# Patient Record
Sex: Female | Born: 1960 | Race: White | Hispanic: No | Marital: Married | Smoking: Never smoker
Health system: Southern US, Community
[De-identification: ages and names within clinical notes are randomized; demographics above are authoritative.]

## PROBLEM LIST (undated history)

## (undated) DIAGNOSIS — K8020 Calculus of gallbladder without cholecystitis without obstruction: Secondary | ICD-10-CM

## (undated) DIAGNOSIS — N200 Calculus of kidney: Secondary | ICD-10-CM

## (undated) DIAGNOSIS — K3532 Acute appendicitis with perforation and localized peritonitis, without abscess: Secondary | ICD-10-CM

## (undated) DIAGNOSIS — E785 Hyperlipidemia, unspecified: Secondary | ICD-10-CM

## (undated) DIAGNOSIS — E042 Nontoxic multinodular goiter: Secondary | ICD-10-CM

## (undated) DIAGNOSIS — I10 Essential (primary) hypertension: Secondary | ICD-10-CM

## (undated) DIAGNOSIS — G4733 Obstructive sleep apnea (adult) (pediatric): Secondary | ICD-10-CM

## (undated) DIAGNOSIS — G5601 Carpal tunnel syndrome, right upper limb: Secondary | ICD-10-CM

## (undated) DIAGNOSIS — Z974 Presence of external hearing-aid: Secondary | ICD-10-CM

## (undated) DIAGNOSIS — N840 Polyp of corpus uteri: Secondary | ICD-10-CM

## (undated) DIAGNOSIS — N950 Postmenopausal bleeding: Secondary | ICD-10-CM

## (undated) DIAGNOSIS — C801 Malignant (primary) neoplasm, unspecified: Secondary | ICD-10-CM

## (undated) DIAGNOSIS — M069 Rheumatoid arthritis, unspecified: Secondary | ICD-10-CM

## (undated) DIAGNOSIS — I499 Cardiac arrhythmia, unspecified: Secondary | ICD-10-CM

## (undated) DIAGNOSIS — Z862 Personal history of diseases of the blood and blood-forming organs and certain disorders involving the immune mechanism: Secondary | ICD-10-CM

## (undated) DIAGNOSIS — E669 Obesity, unspecified: Secondary | ICD-10-CM

## (undated) DIAGNOSIS — M1990 Unspecified osteoarthritis, unspecified site: Secondary | ICD-10-CM

## (undated) DIAGNOSIS — K219 Gastro-esophageal reflux disease without esophagitis: Secondary | ICD-10-CM

## (undated) DIAGNOSIS — H9191 Unspecified hearing loss, right ear: Secondary | ICD-10-CM

## (undated) DIAGNOSIS — Z973 Presence of spectacles and contact lenses: Secondary | ICD-10-CM

## (undated) DIAGNOSIS — C642 Malignant neoplasm of left kidney, except renal pelvis: Secondary | ICD-10-CM

## (undated) DIAGNOSIS — Z87442 Personal history of urinary calculi: Secondary | ICD-10-CM

## (undated) DIAGNOSIS — N2889 Other specified disorders of kidney and ureter: Secondary | ICD-10-CM

## (undated) DIAGNOSIS — J302 Other seasonal allergic rhinitis: Secondary | ICD-10-CM

## (undated) HISTORY — PX: TUBAL LIGATION: SHX77

## (undated) HISTORY — PX: DILATION AND CURETTAGE OF UTERUS: SHX78

---
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
## 2015-09-07 DIAGNOSIS — G4733 Obstructive sleep apnea (adult) (pediatric): Secondary | ICD-10-CM | POA: Insufficient documentation

## 2016-09-06 HISTORY — PX: KIDNEY SURGERY: SHX687

## 2017-05-13 ENCOUNTER — Emergency Department
Admission: EM | Admit: 2017-05-13 | Discharge: 2017-05-13 | Disposition: A | Discharge status: Home / Self Care | Payer: Managed Care, Other (non HMO) | Source: Home / Self Care | Attending: Family Medicine | Admitting: Family Medicine

## 2017-05-13 ENCOUNTER — Emergency Department (HOSPITAL_COMMUNITY): Payer: Managed Care, Other (non HMO) | Admitting: Anesthesiology

## 2017-05-13 ENCOUNTER — Emergency Department (INDEPENDENT_AMBULATORY_CARE_PROVIDER_SITE_OTHER): Payer: Managed Care, Other (non HMO)

## 2017-05-13 ENCOUNTER — Encounter (HOSPITAL_COMMUNITY): Payer: Self-pay

## 2017-05-13 ENCOUNTER — Inpatient Hospital Stay (HOSPITAL_COMMUNITY)
Admission: EM | Admit: 2017-05-13 | Discharge: 2017-05-18 | DRG: 340 | Disposition: A | Discharge status: Home / Self Care | Payer: Managed Care, Other (non HMO) | Attending: General Surgery | Admitting: General Surgery

## 2017-05-13 ENCOUNTER — Encounter: Payer: Self-pay | Admitting: *Deleted

## 2017-05-13 ENCOUNTER — Encounter (HOSPITAL_COMMUNITY): Admission: EM | Disposition: A | Discharge status: Home / Self Care | Payer: Self-pay | Source: Home / Self Care

## 2017-05-13 DIAGNOSIS — Z7952 Long term (current) use of systemic steroids: Secondary | ICD-10-CM | POA: Diagnosis not present

## 2017-05-13 DIAGNOSIS — K5641 Fecal impaction: Secondary | ICD-10-CM | POA: Diagnosis present

## 2017-05-13 DIAGNOSIS — K8080 Other cholelithiasis without obstruction: Secondary | ICD-10-CM | POA: Diagnosis not present

## 2017-05-13 DIAGNOSIS — N2889 Other specified disorders of kidney and ureter: Secondary | ICD-10-CM | POA: Diagnosis not present

## 2017-05-13 DIAGNOSIS — K3580 Unspecified acute appendicitis: Secondary | ICD-10-CM

## 2017-05-13 DIAGNOSIS — K3532 Acute appendicitis with perforation and localized peritonitis, without abscess: Secondary | ICD-10-CM

## 2017-05-13 DIAGNOSIS — K8020 Calculus of gallbladder without cholecystitis without obstruction: Secondary | ICD-10-CM

## 2017-05-13 DIAGNOSIS — M069 Rheumatoid arthritis, unspecified: Secondary | ICD-10-CM | POA: Diagnosis present

## 2017-05-13 DIAGNOSIS — Z79899 Other long term (current) drug therapy: Secondary | ICD-10-CM | POA: Diagnosis not present

## 2017-05-13 DIAGNOSIS — R1084 Generalized abdominal pain: Secondary | ICD-10-CM

## 2017-05-13 DIAGNOSIS — K352 Acute appendicitis with generalized peritonitis: Principal | ICD-10-CM | POA: Diagnosis present

## 2017-05-13 HISTORY — DX: Calculus of kidney: N20.0

## 2017-05-13 HISTORY — DX: Calculus of gallbladder without cholecystitis without obstruction: K80.20

## 2017-05-13 HISTORY — DX: Other seasonal allergic rhinitis: J30.2

## 2017-05-13 HISTORY — PX: LAPAROSCOPIC APPENDECTOMY: SHX408

## 2017-05-13 HISTORY — DX: Unspecified osteoarthritis, unspecified site: M19.90

## 2017-05-13 LAB — TYPE AND SCREEN
ABO/RH(D): A POS
Antibody Screen: NEGATIVE

## 2017-05-13 LAB — CBC WITH DIFFERENTIAL/PLATELET
Basophils Absolute: 0 10*3/uL (ref 0.0–0.1)
Basophils Relative: 0 %
Eosinophils Absolute: 0 10*3/uL (ref 0.0–0.7)
Eosinophils Relative: 0 %
HCT: 41.1 % (ref 36.0–46.0)
Hemoglobin: 14.2 g/dL (ref 12.0–15.0)
Lymphocytes Relative: 8 %
Lymphs Abs: 1 10*3/uL (ref 0.7–4.0)
MCH: 30.1 pg (ref 26.0–34.0)
MCHC: 34.5 g/dL (ref 30.0–36.0)
MCV: 87.1 fL (ref 78.0–100.0)
Monocytes Absolute: 1 10*3/uL (ref 0.1–1.0)
Monocytes Relative: 8 %
Neutro Abs: 10.8 10*3/uL — ABNORMAL HIGH (ref 1.7–7.7)
Neutrophils Relative %: 84 %
Platelets: 116 10*3/uL — ABNORMAL LOW (ref 150–400)
RBC: 4.72 MIL/uL (ref 3.87–5.11)
RDW: 13.3 % (ref 11.5–15.5)
WBC: 12.9 10*3/uL — ABNORMAL HIGH (ref 4.0–10.5)

## 2017-05-13 LAB — POCT CBC W AUTO DIFF (K'VILLE URGENT CARE)

## 2017-05-13 LAB — POCT URINALYSIS DIP (MANUAL ENTRY)
Blood, UA: NEGATIVE
Glucose, UA: NEGATIVE mg/dL
Leukocytes, UA: NEGATIVE
Nitrite, UA: POSITIVE — AB
Protein Ur, POC: 100 mg/dL — AB
Spec Grav, UA: >=1.03 — AB (ref 1.010–1.025)
Urobilinogen, UA: 1 E.U./dL
pH, UA: 6 (ref 5.0–8.0)

## 2017-05-13 LAB — BASIC METABOLIC PANEL
Anion gap: 11 (ref 5–15)
BUN: 8 mg/dL (ref 6–20)
CO2: 24 mmol/L (ref 22–32)
Calcium: 9.2 mg/dL (ref 8.9–10.3)
Chloride: 103 mmol/L (ref 101–111)
Creatinine, Ser: 0.71 mg/dL (ref 0.44–1.00)
GFR calc Af Amer: >60 mL/min (ref >60)
GFR calc non Af Amer: >60 mL/min (ref >60)
Glucose, Bld: 99 mg/dL (ref 65–99)
Potassium: 3.4 mmol/L — ABNORMAL LOW (ref 3.5–5.1)
Sodium: 138 mmol/L (ref 135–145)

## 2017-05-13 LAB — LIPASE, BLOOD: Lipase: 26 U/L (ref 11–51)

## 2017-05-13 LAB — PROTIME-INR
INR: 1.05
Prothrombin Time: 13.6 seconds (ref 11.4–15.2)

## 2017-05-13 LAB — ABO/RH: ABO/RH(D): A POS

## 2017-05-13 SURGERY — APPENDECTOMY, LAPAROSCOPIC
Anesthesia: General | Site: Abdomen

## 2017-05-13 MED ORDER — HYDROMORPHONE HCL 1 MG/ML IJ SOLN
0.2500 mg | INTRAMUSCULAR | Status: DC | PRN
  Administered 2017-05-13: 0.5 mg via INTRAVENOUS

## 2017-05-13 MED ORDER — ONDANSETRON HCL 4 MG/2ML IJ SOLN: 4.0000 mg | Freq: Four times a day (QID) | INTRAMUSCULAR | Status: DC | PRN

## 2017-05-13 MED ORDER — FENTANYL CITRATE (PF) 250 MCG/5ML IJ SOLN
INTRAMUSCULAR | Status: AC
  Filled 2017-05-13: qty 5

## 2017-05-13 MED ORDER — LACTATED RINGERS IV SOLN
INTRAVENOUS | Status: DC | PRN
  Administered 2017-05-13 (×2): via INTRAVENOUS

## 2017-05-13 MED ORDER — HYDRALAZINE HCL 20 MG/ML IJ SOLN: 10.0000 mg | INTRAMUSCULAR | Status: DC | PRN

## 2017-05-13 MED ORDER — ACETAMINOPHEN 500 MG PO TABS
1000.0000 mg | ORAL_TABLET | Freq: Four times a day (QID) | ORAL | Status: DC
  Administered 2017-05-14 – 2017-05-18 (×12): 1000 mg via ORAL
  Filled 2017-05-13 (×15): qty 2

## 2017-05-13 MED ORDER — MORPHINE SULFATE (PF) 4 MG/ML IV SOLN
4.0000 mg | Freq: Once | INTRAVENOUS | Status: AC
  Administered 2017-05-13: 4 mg via INTRAVENOUS
  Filled 2017-05-13: qty 1

## 2017-05-13 MED ORDER — SUCCINYLCHOLINE CHLORIDE 20 MG/ML IJ SOLN
INTRAMUSCULAR | Status: DC | PRN
  Administered 2017-05-13: 100 mg via INTRAVENOUS

## 2017-05-13 MED ORDER — PIPERACILLIN-TAZOBACTAM 3.375 G IVPB
3.3750 g | Freq: Three times a day (TID) | INTRAVENOUS | Status: DC
  Administered 2017-05-14 – 2017-05-17 (×12): 3.375 g via INTRAVENOUS
  Filled 2017-05-13 (×15): qty 50

## 2017-05-13 MED ORDER — SERTRALINE HCL 25 MG PO TABS
12.5000 mg | ORAL_TABLET | Freq: Every day | ORAL | Status: DC
  Administered 2017-05-14 – 2017-05-17 (×4): 12.5 mg via ORAL
  Filled 2017-05-13 (×6): qty 0.5

## 2017-05-13 MED ORDER — ONDANSETRON HCL 4 MG/2ML IJ SOLN
4.0000 mg | Freq: Once | INTRAMUSCULAR | Status: AC
  Administered 2017-05-13: 4 mg via INTRAVENOUS
  Filled 2017-05-13: qty 2

## 2017-05-13 MED ORDER — FENTANYL CITRATE (PF) 100 MCG/2ML IJ SOLN
INTRAMUSCULAR | Status: DC | PRN
  Administered 2017-05-13 (×5): 50 ug via INTRAVENOUS

## 2017-05-13 MED ORDER — BUPIVACAINE-EPINEPHRINE 0.25% -1:200000 IJ SOLN
INTRAMUSCULAR | Status: DC | PRN
  Administered 2017-05-13: 10 mL

## 2017-05-13 MED ORDER — PIPERACILLIN-TAZOBACTAM 3.375 G IVPB 30 MIN
3.3750 g | Freq: Once | INTRAVENOUS | Status: AC
  Administered 2017-05-13: 3.375 g via INTRAVENOUS
  Filled 2017-05-13: qty 50

## 2017-05-13 MED ORDER — MIDAZOLAM HCL 2 MG/2ML IJ SOLN
INTRAMUSCULAR | Status: AC
  Filled 2017-05-13: qty 2

## 2017-05-13 MED ORDER — ROCURONIUM BROMIDE 100 MG/10ML IV SOLN
INTRAVENOUS | Status: DC | PRN
  Administered 2017-05-13: 50 mg via INTRAVENOUS

## 2017-05-13 MED ORDER — MORPHINE SULFATE (PF) 4 MG/ML IV SOLN
2.0000 mg | INTRAVENOUS | Status: DC | PRN
  Administered 2017-05-14 (×3): 2 mg via INTRAVENOUS
  Filled 2017-05-13 (×4): qty 1

## 2017-05-13 MED ORDER — 0.9 % SODIUM CHLORIDE (POUR BTL) OPTIME
TOPICAL | Status: DC | PRN
  Administered 2017-05-13: 1000 mL

## 2017-05-13 MED ORDER — OXYCODONE HCL 5 MG PO TABS
5.0000 mg | ORAL_TABLET | ORAL | Status: DC | PRN
  Administered 2017-05-15 – 2017-05-17 (×3): 10 mg via ORAL
  Filled 2017-05-13 (×3): qty 2

## 2017-05-13 MED ORDER — HYDROMORPHONE HCL 1 MG/ML IJ SOLN
INTRAMUSCULAR | Status: AC
  Filled 2017-05-13: qty 1

## 2017-05-13 MED ORDER — PROPOFOL 10 MG/ML IV BOLUS
INTRAVENOUS | Status: AC
  Filled 2017-05-13: qty 20

## 2017-05-13 MED ORDER — ONDANSETRON 4 MG PO TBDP
4.0000 mg | ORAL_TABLET | Freq: Once | ORAL | Status: AC
Start: 2017-05-13 — End: 2017-05-13
  Administered 2017-05-13: 4 mg via ORAL

## 2017-05-13 MED ORDER — LIDOCAINE HCL (CARDIAC) 20 MG/ML IV SOLN
INTRAVENOUS | Status: DC | PRN
  Administered 2017-05-13: 60 mg via INTRAVENOUS

## 2017-05-13 MED ORDER — MONTELUKAST SODIUM 10 MG PO TABS
10.0000 mg | ORAL_TABLET | Freq: Every day | ORAL | Status: DC
  Administered 2017-05-14 – 2017-05-17 (×5): 10 mg via ORAL
  Filled 2017-05-13 (×5): qty 1

## 2017-05-13 MED ORDER — SUGAMMADEX SODIUM 200 MG/2ML IV SOLN
INTRAVENOUS | Status: DC | PRN
  Administered 2017-05-13: 200 mg via INTRAVENOUS

## 2017-05-13 MED ORDER — PREDNISOLONE 5 MG PO TABS
5.0000 mg | ORAL_TABLET | Freq: Every day | ORAL | Status: DC
  Administered 2017-05-14 – 2017-05-17 (×4): 5 mg via ORAL
  Filled 2017-05-13 (×5): qty 1

## 2017-05-13 MED ORDER — BUPIVACAINE-EPINEPHRINE (PF) 0.25% -1:200000 IJ SOLN
INTRAMUSCULAR | Status: AC
  Filled 2017-05-13: qty 30

## 2017-05-13 MED ORDER — SODIUM CHLORIDE 0.9 % IR SOLN
Status: DC | PRN
  Administered 2017-05-13: 1000 mL

## 2017-05-13 MED ORDER — PROPOFOL 10 MG/ML IV BOLUS
INTRAVENOUS | Status: DC | PRN
  Administered 2017-05-13: 130 mg via INTRAVENOUS

## 2017-05-13 MED ORDER — IOPAMIDOL (ISOVUE-300) INJECTION 61%
100.0000 mL | Freq: Once | INTRAVENOUS | Status: AC | PRN
  Administered 2017-05-13: 100 mL via INTRAVENOUS

## 2017-05-13 MED ORDER — SIMETHICONE 80 MG PO CHEW
40.0000 mg | CHEWABLE_TABLET | Freq: Four times a day (QID) | ORAL | Status: DC | PRN
  Administered 2017-05-15: 40 mg via ORAL
  Filled 2017-05-13: qty 1

## 2017-05-13 MED ORDER — HYDROCORTISONE NA SUCCINATE PF 100 MG IJ SOLR
INTRAMUSCULAR | Status: DC | PRN
  Administered 2017-05-13: 125 mg via INTRAVENOUS

## 2017-05-13 MED ORDER — ENOXAPARIN SODIUM 40 MG/0.4ML ~~LOC~~ SOLN
40.0000 mg | SUBCUTANEOUS | Status: DC
  Administered 2017-05-14: 40 mg via SUBCUTANEOUS
  Filled 2017-05-13 (×2): qty 0.4

## 2017-05-13 MED ORDER — ONDANSETRON 4 MG PO TBDP: 4.0000 mg | ORAL_TABLET | Freq: Four times a day (QID) | ORAL | Status: DC | PRN

## 2017-05-13 MED ORDER — SODIUM CHLORIDE 0.9 % IV SOLN
INTRAVENOUS | Status: DC
  Administered 2017-05-13: 23:00:00 via INTRAVENOUS

## 2017-05-13 MED ORDER — ONDANSETRON HCL 4 MG/2ML IJ SOLN
INTRAMUSCULAR | Status: DC | PRN
  Administered 2017-05-13: 4 mg via INTRAVENOUS

## 2017-05-13 MED ORDER — SODIUM CHLORIDE 0.9 % IV BOLUS (SEPSIS)
500.0000 mL | Freq: Once | INTRAVENOUS | Status: AC
  Administered 2017-05-13: 500 mL via INTRAVENOUS

## 2017-05-13 SURGICAL SUPPLY — 44 items
APPLIER CLIP ROT 10 11.4 M/L (STAPLE)
CANISTER SUCT 3000ML PPV (MISCELLANEOUS) ×2 IMPLANT
CHLORAPREP W/TINT 26ML (MISCELLANEOUS) ×2 IMPLANT
CLIP APPLIE ROT 10 11.4 M/L (STAPLE) IMPLANT
COVER SURGICAL LIGHT HANDLE (MISCELLANEOUS) ×2 IMPLANT
CUTTER FLEX LINEAR 45M (STAPLE) ×4 IMPLANT
DERMABOND ADHESIVE PROPEN (GAUZE/BANDAGES/DRESSINGS) ×1
DERMABOND ADVANCED (GAUZE/BANDAGES/DRESSINGS) ×1
DERMABOND ADVANCED .7 DNX12 (GAUZE/BANDAGES/DRESSINGS) ×1 IMPLANT
DERMABOND ADVANCED .7 DNX6 (GAUZE/BANDAGES/DRESSINGS) ×1 IMPLANT
DEVICE TROCAR PUNCTURE CLOSURE (ENDOMECHANICALS) ×4 IMPLANT
DRAIN CHANNEL 15F RND FF W/TCR (WOUND CARE) ×2 IMPLANT
DRAIN CHANNEL 19F RND (DRAIN) ×2 IMPLANT
ELECT REM PT RETURN 9FT ADLT (ELECTROSURGICAL) ×2
ELECTRODE REM PT RTRN 9FT ADLT (ELECTROSURGICAL) ×1 IMPLANT
EVACUATOR SILICONE 100CC (DRAIN) ×2 IMPLANT
GLOVE BIO SURGEON STRL SZ7 (GLOVE) ×2 IMPLANT
GLOVE BIOGEL PI IND STRL 7.5 (GLOVE) ×1 IMPLANT
GLOVE BIOGEL PI INDICATOR 7.5 (GLOVE) ×1
GOWN STRL REUS W/ TWL LRG LVL3 (GOWN DISPOSABLE) ×3 IMPLANT
GOWN STRL REUS W/TWL LRG LVL3 (GOWN DISPOSABLE) ×3
KIT BASIN OR (CUSTOM PROCEDURE TRAY) ×2 IMPLANT
KIT ROOM TURNOVER OR (KITS) ×2 IMPLANT
NS IRRIG 1000ML POUR BTL (IV SOLUTION) ×2 IMPLANT
PAD ARMBOARD 7.5X6 YLW CONV (MISCELLANEOUS) ×4 IMPLANT
POUCH RETRIEVAL ECOSAC 10 (ENDOMECHANICALS) ×1 IMPLANT
POUCH RETRIEVAL ECOSAC 10MM (ENDOMECHANICALS) ×1
RELOAD 45 VASCULAR/THIN (ENDOMECHANICALS) ×2 IMPLANT
RELOAD STAPLE TA45 3.5 REG BLU (ENDOMECHANICALS) ×2 IMPLANT
SCISSORS LAP 5X35 DISP (ENDOMECHANICALS) IMPLANT
SET IRRIG TUBING LAPAROSCOPIC (IRRIGATION / IRRIGATOR) ×2 IMPLANT
SHEARS HARMONIC ACE PLUS 36CM (ENDOMECHANICALS) ×2 IMPLANT
SLEEVE ENDOPATH XCEL 5M (ENDOMECHANICALS) ×2 IMPLANT
SPECIMEN JAR SMALL (MISCELLANEOUS) ×2 IMPLANT
STRIP CLOSURE SKIN 1/2X4 (GAUZE/BANDAGES/DRESSINGS) ×2 IMPLANT
SUT MNCRL AB 4-0 PS2 18 (SUTURE) ×4 IMPLANT
SUT VICRYL 0 UR6 27IN ABS (SUTURE) ×8 IMPLANT
TOWEL OR 17X24 6PK STRL BLUE (TOWEL DISPOSABLE) ×2 IMPLANT
TOWEL OR 17X26 10 PK STRL BLUE (TOWEL DISPOSABLE) ×2 IMPLANT
TRAY FOLEY CATH SILVER 16FR (SET/KITS/TRAYS/PACK) ×2 IMPLANT
TRAY LAPAROSCOPIC MC (CUSTOM PROCEDURE TRAY) ×2 IMPLANT
TROCAR XCEL BLUNT TIP 100MML (ENDOMECHANICALS) ×2 IMPLANT
TROCAR XCEL NON-BLD 5MMX100MML (ENDOMECHANICALS) ×2 IMPLANT
TUBING INSUFFLATION (TUBING) ×2 IMPLANT

## 2017-05-13 NOTE — H&P (Signed)
Amanda Brewer is an 56 y.o. female.   Chief Complaint: abd pain HPI: 33 yof with 3 days of generalized abdominal pain that is in rlq now. Nothing is helping this pain, she has n/v.  Has anorexia.  Not eating or drinking.  Had a bm last night.  Low grade fever.  She was seen at urgent care and underwent evaluation with ct that showed appendicitis. She was then transferred here.  She was diagnosed with rheumatoid arthritis and has been on prednisone for 3 months.  She is now on maintenance dose.  She has also started humira last dose 2 weeks ago.     Past Medical History:  Diagnosis Date  . Arthritis   . Gallstones   . Kidney stone   . Seasonal allergies     Past Surgical History:  Procedure Laterality Date  . DILATION AND CURETTAGE OF UTERUS    . TUBAL LIGATION      Family History  Problem Relation Age of Onset  . Hypertension Mother   . Osteoarthritis Mother   . Heart attack Father   . Stroke Father   . Alzheimer's disease Father   . Hypertension Father   . Heart attack Brother    Social History:  reports that she has never smoked. She has never used smokeless tobacco. She reports that she does not drink alcohol or use drugs.  Allergies: No Known Allergies  meds reviewed include humira, plaquenil and prednisone  Results for orders placed or performed during the hospital encounter of 05/13/17 (from the past 48 hour(s))  Basic metabolic panel     Status: Abnormal   Collection Time: 05/13/17  4:20 PM  Result Value Ref Range   Sodium 138 135 - 145 mmol/L   Potassium 3.4 (L) 3.5 - 5.1 mmol/L   Chloride 103 101 - 111 mmol/L   CO2 24 22 - 32 mmol/L   Glucose, Bld 99 65 - 99 mg/dL   BUN 8 6 - 20 mg/dL   Creatinine, Ser 0.71 0.44 - 1.00 mg/dL   Calcium 9.2 8.9 - 10.3 mg/dL   GFR calc non Af Amer >60 >60 mL/min   GFR calc Af Amer >60 >60 mL/min    Comment: (NOTE) The eGFR has been calculated using the CKD EPI equation. This calculation has not been validated in all clinical  situations. eGFR's persistently <60 mL/min signify possible Chronic Kidney Disease.    Anion gap 11 5 - 15  Lipase, blood     Status: None   Collection Time: 05/13/17  4:20 PM  Result Value Ref Range   Lipase 26 11 - 51 U/L  CBC with Differential     Status: Abnormal   Collection Time: 05/13/17  5:50 PM  Result Value Ref Range   WBC 12.9 (H) 4.0 - 10.5 K/uL   RBC 4.72 3.87 - 5.11 MIL/uL   Hemoglobin 14.2 12.0 - 15.0 g/dL   HCT 41.1 36.0 - 46.0 %   MCV 87.1 78.0 - 100.0 fL   MCH 30.1 26.0 - 34.0 pg   MCHC 34.5 30.0 - 36.0 g/dL   RDW 13.3 11.5 - 15.5 %   Platelets 116 (L) 150 - 400 K/uL    Comment: REPEATED TO VERIFY SPECIMEN CHECKED FOR CLOTS PLATELET COUNT CONFIRMED BY SMEAR    Neutrophils Relative % 84 %   Neutro Abs 10.8 (H) 1.7 - 7.7 K/uL   Lymphocytes Relative 8 %   Lymphs Abs 1.0 0.7 - 4.0 K/uL   Monocytes  Relative 8 %   Monocytes Absolute 1.0 0.1 - 1.0 K/uL   Eosinophils Relative 0 %   Eosinophils Absolute 0.0 0.0 - 0.7 K/uL   Basophils Relative 0 %   Basophils Absolute 0.0 0.0 - 0.1 K/uL  Type and screen [place]     Status: None   Collection Time: 05/13/17  5:50 PM  Result Value Ref Range   ABO/RH(D) A POS    Antibody Screen NEG    Sample Expiration 05/16/2017   ABO/Rh     Status: None   Collection Time: 05/13/17  5:50 PM  Result Value Ref Range   ABO/RH(D) A POS    Ct Abdomen Pelvis W Contrast  Result Date: 05/13/2017 CLINICAL DATA:  Abdominal pain, nausea for 3 days EXAM: CT ABDOMEN AND PELVIS WITH CONTRAST TECHNIQUE: Multidetector CT imaging of the abdomen and pelvis was performed using the standard protocol following bolus administration of intravenous contrast. CONTRAST:  13m ISOVUE-300 IOPAMIDOL (ISOVUE-300) INJECTION 61% COMPARISON:  None. FINDINGS: Lower chest: Lung bases demonstrate no acute consolidation or effusion. Normal heart size. Hepatobiliary: Large calcified gallstones. No focal hepatic abnormality. No biliary dilatation  Pancreas: Unremarkable. No pancreatic ductal dilatation or surrounding inflammatory changes. Spleen: Normal in size without focal abnormality. Adrenals/Urinary Tract: Adrenal glands are within normal limits. Kidneys show no hydronephrosis. There is a large 6.1 cm cyst in the lower pole left kidney. Immediately adjacent to this is a 4 cm solid-appearing renal mass. The bladder is unremarkable Stomach/Bowel: Stomach is nonenlarged. No dilated small bowel. Enlarged appendix measuring up to 1 cm with 6 mm appendicolith. Moderate surrounding inflammation compatible with acute appendicitis. There is free fluid in the pelvis and significant inflammatory change in the periappendiceal mesentery. There is wall thickening and inflammatory change of the adjacent distal ileal bowel loops. Vascular/Lymphatic: Aortic atherosclerosis. No enlarged abdominal or pelvic lymph nodes. Reproductive: Uterus and bilateral adnexa are unremarkable. Bilateral tubal ligation clips Other: Negative for free air.  Small fat in the umbilicus. Musculoskeletal: Degenerative changes. No acute or suspicious bone lesion IMPRESSION: 1. Findings are consistent with an acute appendicitis. Free-fluid and moderate inflammatory changes surrounding the appendix raise possibility of perforation, however no extraluminal gas collection or free air is seen. Thickening of adjacent distal small bowel loops is likely reactive. Negative for a bowel obstruction. 2. 4 cm solid-appearing mass in the lower pole of the left kidney. Further evaluation with nonemergent renal CT or MRI recommended. 3. Large gallstones Critical Value/emergent results were called by telephone at the time of interpretation on 05/13/2017 at 3:10 pm to Dr. ELeeroy Cha, who verbally acknowledged these results. Electronically Signed   By: KDonavan FoilM.D.   On: 05/13/2017 15:10    Review of Systems  Constitutional: Positive for fever.  Gastrointestinal: Positive for abdominal pain, nausea and  vomiting.  All other systems reviewed and are negative.   Blood pressure (!) 144/76, pulse 98, temperature 100.2 F (37.9 C), temperature source Oral, resp. rate 18, height '5\' 2"'$  (1.575 m), weight 71.2 kg (157 lb), SpO2 98 %. Physical Exam  Vitals reviewed. Constitutional: She is oriented to person, place, and time. She appears well-developed and well-nourished.  HENT:  Head: Normocephalic and atraumatic.  Mouth/Throat: Oropharynx is clear and moist.  Eyes: Pupils are equal, round, and reactive to light. No scleral icterus.  Neck: Neck supple.  Cardiovascular: Normal rate, regular rhythm and normal heart sounds.   Respiratory: Effort normal and breath sounds normal. She has no wheezes. She has  no rales.  GI: Soft. She exhibits no distension. Bowel sounds are decreased. There is tenderness in the right lower quadrant and suprapubic area. No hernia.  Lymphadenopathy:    She has no cervical adenopathy.  Neurological: She is alert and oriented to person, place, and time.  Skin: Skin is warm. She is diaphoretic.  Psychiatric: She has a normal mood and affect.     Assessment/Plan Appendicitis  I think she needs appendectomy.  Steroids and humira are concerning and she looks ill right now.  Will give abx and proceed with lap appy tonight.  I discussed risks including abscess, leak at stump, bleeding, infection.  Her risks are certainly increased with immunosuppressants.   Rolm Bookbinder, MD 05/13/2017, 7:03 PM

## 2017-05-13 NOTE — Op Note (Signed)
Preoperative diagnosis: acute appendicitis Postoperative diagnosis: perforated appendicitis Procedure: laparoscopic appendectomy Surgeon: Dr Serita Grammes Anesthesia: general EBL: minimal Drains none Specimen: appendix to pathology Complications: none Sponge count correct at completion Disposition to recovery stable  Indications: This is a 2 yof on immunosuppression for RA who presents with likely ruptured appendicitis. Her wbc is elevated and she appears ill  We discussed laparoscopic appendectomy.   Procedure: After informed consent was obtained the patient was taken to the operating room She was given antibiotics. Sequential compression devices were on herlegs. Shewas placed under general anesthesia without complication. Her abdomen was prepped and draped in the standard sterile surgical fashion. A surgical timeout was then performed.  I infiltrated marcaine belowthe umbilicus. I made a vertical incision and grasped the fascia. This was entered sharply. I then placed a 0 vicryl pursestring suture and inserted a Hasson trocarI then placed 2 other 5 mm trocars in llq and suprapubic region. I identified the appendix and grasped the base.  The base was clean. the terminal ileum was adherent to the appendix. I very carefully dissected the appendix out of the pelvis where it was stuck in an abscess cavity.  It had perforated and I found two fecaliths that I removed.  The abscess was drained.   I then divided the appendix at the cecum. I was able to then dissect the remaining appendix into the pocket where it was perforated and remove this.  This was placed in a bag and removed. I viewed the staple line on the cecum and this appears to be intact and viable. There was no injury to cecum or small bowel noted. I then obtained hemostasis and irrigated.I removed the hasson  trocar. I tied my pursestring down and added several 2-0vicryl stitchat the umbilical incision with the endoclose  device. I then inserted a 15 Fr Blake drain via the suprapubic port and secured this with 2-0 nylon.  I then desufflated the abdomen and removed all my remaining trocars. I then closed these with 4-0 Monocryl and Dermabond. She tolerated this well was extubated and transferred to the recovery room in stable condition

## 2017-05-13 NOTE — ED Triage Notes (Signed)
Pt sent from [facility] in [place] for possible ruptured appendix. Pt already had CT scan and blood work. VSS. Oral temp 100.3.

## 2017-05-13 NOTE — ED Notes (Signed)
Bed: GYK5 Expected date:  Expected time:  Means of arrival:  Comments: Amanda Brewer

## 2017-05-13 NOTE — ED Triage Notes (Signed)
Pt c/o generalized abd pain x 3 day. Reports diarrhea, nausea and dry heaves this morning.

## 2017-05-13 NOTE — Anesthesia Procedure Notes (Signed)
Procedure Name: Intubation Date/Time: 05/13/2017 8:50 PM Performed by: Manuela Schwartz B Pre-anesthesia Checklist: Patient identified, Emergency Drugs available, Suction available and Patient being monitored Patient Re-evaluated:Patient Re-evaluated prior to induction Oxygen Delivery Method: Circle System Utilized Preoxygenation: Pre-oxygenation with 100% oxygen Induction Type: IV induction, Rapid sequence and Cricoid Pressure applied Laryngoscope Size: Mac and 3 Grade View: Grade I Tube type: Oral Tube size: 7.0 mm Number of attempts: 1 Airway Equipment and Method: Stylet and Oral airway Placement Confirmation: ETT inserted through vocal cords under direct vision,  positive ETCO2 and breath sounds checked- equal and bilateral Secured at: 21 cm Tube secured with: Tape Dental Injury: Teeth and Oropharynx as per pre-operative assessment

## 2017-05-13 NOTE — Anesthesia Postprocedure Evaluation (Signed)
Anesthesia Post Note  Patient: Amanda Brewer  Procedure(s) Performed: Procedure(s) (LRB): APPENDECTOMY LAPAROSCOPIC (N/A)     Patient location during evaluation: PACU Anesthesia Type: General Level of consciousness: awake and alert Pain management: pain level controlled Vital Signs Assessment: post-procedure vital signs reviewed and stable Respiratory status: spontaneous breathing, nonlabored ventilation, respiratory function stable and patient connected to nasal cannula oxygen Cardiovascular status: blood pressure returned to baseline and stable Postop Assessment: no signs of nausea or vomiting Anesthetic complications: no    Last Vitals:  Vitals:   05/13/17 2300 05/13/17 2315  BP: 134/69 (!) 142/72  Pulse: 70 82  Resp: (!) 7 11  Temp:  36.8 C  SpO2: 99% 99%    Last Pain:  Vitals:   05/13/17 2300  TempSrc:   PainSc: Asleep                 Drago Hammonds,W. EDMOND

## 2017-05-13 NOTE — ED Provider Notes (Signed)
Emergency Department Provider Note   I have reviewed the triage vital signs and the nursing notes.   HISTORY  Chief Complaint Abdominal Pain and possible ruptured appendix   HPI Amanda Brewer is a 56 y.o. female presents to the emergency department for evaluation of worsening abdominal pain and new onset fever. Patient was seen at urgent care prior to ED presentation where a CT scan was obtained which showed acute appendicitis with possible rupture. Patient states she's had generalized abdominal discomfort for the last 3 days which seems to be hurting worse on the right side today. Fever began approximate 4 hours ago. Denies any chest pain or difficulty breathing.   Past Medical History:  Diagnosis Date  . Arthritis   . Gallstones   . Kidney stone   . Seasonal allergies     Patient Active Problem List   Diagnosis Date Noted  . Ruptured appendicitis 05/13/2017    Past Surgical History:  Procedure Laterality Date  . DILATION AND CURETTAGE OF UTERUS    . TUBAL LIGATION        Allergies Patient has no known allergies.  Family History  Problem Relation Age of Onset  . Hypertension Mother   . Osteoarthritis Mother   . Heart attack Father   . Stroke Father   . Alzheimer's disease Father   . Hypertension Father   . Heart attack Brother     Social History Social History  Substance Use Topics  . Smoking status: Never Smoker  . Smokeless tobacco: Never Used  . Alcohol use No    Review of Systems  Constitutional: Positive fever/chills Eyes: No visual changes. ENT: No sore throat. Cardiovascular: Denies chest pain. Respiratory: Denies shortness of breath. Gastrointestinal: Positive abdominal pain. Positive nausea, no vomiting.  No diarrhea.  No constipation. Genitourinary: Negative for dysuria. Musculoskeletal: Negative for back pain. Skin: Negative for rash. Neurological: Negative for headaches, focal weakness or numbness.  10-point ROS otherwise  negative.  ____________________________________________   PHYSICAL EXAM:  VITAL SIGNS: ED Triage Vitals  Enc Vitals Group     BP 05/13/17 1612 129/66     Pulse Rate 05/13/17 1612 88     Resp 05/13/17 1612 16     Temp 05/13/17 1612 100.2 F (37.9 C)     Temp Source 05/13/17 1612 Oral     SpO2 05/13/17 1612 100 %     Weight 05/13/17 1612 157 lb (71.2 kg)     Height 05/13/17 1612 5\' 2"  (1.575 m)     Pain Score 05/13/17 1611 6   Constitutional: Alert and oriented. Well appearing and in no acute distress. Eyes: Conjunctivae are normal. Head: Atraumatic. Nose: No congestion/rhinnorhea. Mouth/Throat: Mucous membranes are moist.  Neck: No stridor.   Cardiovascular: Normal rate, regular rhythm. Good peripheral circulation. Grossly normal heart sounds.   Respiratory: Normal respiratory effort.  No retractions. Lungs CTAB. Gastrointestinal: Soft with diffuse abdominal tenderness worse in the RLQ. No rebound or guarding. No distention.  Musculoskeletal: No lower extremity tenderness nor edema. No gross deformities of extremities. Neurologic:  Normal speech and language. No gross focal neurologic deficits are appreciated.  Skin:  Skin is warm, dry and intact. No rash noted.  ____________________________________________   LABS (all labs ordered are listed, but only abnormal results are displayed)  Labs Reviewed  BASIC METABOLIC PANEL - Abnormal; Notable for the following:       Result Value   Potassium 3.4 (*)    All other components within normal limits  CBC  WITH DIFFERENTIAL/PLATELET - Abnormal; Notable for the following:    WBC 12.9 (*)    Platelets 116 (*)    Neutro Abs 10.8 (*)    All other components within normal limits  LIPASE, BLOOD  PROTIME-INR  BASIC METABOLIC PANEL  CBC  TYPE AND SCREEN  ABO/RH  SURGICAL PATHOLOGY   ____________________________________________  RADIOLOGY  Ct Abdomen Pelvis W Contrast  Result Date: 05/13/2017 CLINICAL DATA:  Abdominal pain,  nausea for 3 days EXAM: CT ABDOMEN AND PELVIS WITH CONTRAST TECHNIQUE: Multidetector CT imaging of the abdomen and pelvis was performed using the standard protocol following bolus administration of intravenous contrast. CONTRAST:  144mL ISOVUE-300 IOPAMIDOL (ISOVUE-300) INJECTION 61% COMPARISON:  None. FINDINGS: Lower chest: Lung bases demonstrate no acute consolidation or effusion. Normal heart size. Hepatobiliary: Large calcified gallstones. No focal hepatic abnormality. No biliary dilatation Pancreas: Unremarkable. No pancreatic ductal dilatation or surrounding inflammatory changes. Spleen: Normal in size without focal abnormality. Adrenals/Urinary Tract: Adrenal glands are within normal limits. Kidneys show no hydronephrosis. There is a large 6.1 cm cyst in the lower pole left kidney. Immediately adjacent to this is a 4 cm solid-appearing renal mass. The bladder is unremarkable Stomach/Bowel: Stomach is nonenlarged. No dilated small bowel. Enlarged appendix measuring up to 1 cm with 6 mm appendicolith. Moderate surrounding inflammation compatible with acute appendicitis. There is free fluid in the pelvis and significant inflammatory change in the periappendiceal mesentery. There is wall thickening and inflammatory change of the adjacent distal ileal bowel loops. Vascular/Lymphatic: Aortic atherosclerosis. No enlarged abdominal or pelvic lymph nodes. Reproductive: Uterus and bilateral adnexa are unremarkable. Bilateral tubal ligation clips Other: Negative for free air.  Small fat in the umbilicus. Musculoskeletal: Degenerative changes. No acute or suspicious bone lesion IMPRESSION: 1. Findings are consistent with an acute appendicitis. Free-fluid and moderate inflammatory changes surrounding the appendix raise possibility of perforation, however no extraluminal gas collection or free air is seen. Thickening of adjacent distal small bowel loops is likely reactive. Negative for a bowel obstruction. 2. 4 cm  solid-appearing mass in the lower pole of the left kidney. Further evaluation with nonemergent renal CT or MRI recommended. 3. Large gallstones Critical Value/emergent results were called by telephone at the time of interpretation on 05/13/2017 at 3:10 pm to Dr. Leeroy Cha , who verbally acknowledged these results. Electronically Signed   By: Donavan Foil M.D.   On: 05/13/2017 15:10    ____________________________________________   PROCEDURES  Procedure(s) performed:   Procedures  None ____________________________________________   INITIAL IMPRESSION / ASSESSMENT AND PLAN / ED COURSE  Pertinent labs & imaging results that were available during my care of the patient were reviewed by me and considered in my medical decision making (see chart for details).  Patient resents to the emergency department for evaluation of abdominal pain. CT prior to ED presentation shows acute appendicitis with concern for possible perforation. No extraluminal air. Patient is borderline febrile. Starting Zosyn along with IV fluids, pain/nausea medication.  Discussed patient's case with General Surgery to request admission. Patient and family (if present) updated with plan. Care transferred to Surgery service.  I reviewed all nursing notes, vitals, pertinent old records, EKGs, labs, imaging (as available).  ____________________________________________  FINAL CLINICAL IMPRESSION(S) / ED DIAGNOSES  Final diagnoses:  Acute appendicitis, unspecified acute appendicitis type     MEDICATIONS GIVEN DURING THIS VISIT:  Medications  montelukast (SINGULAIR) tablet 10 mg (not administered)  prednisoLONE tablet 5 mg (not administered)  sertraline (ZOLOFT) tablet 12.5 mg (not administered)  enoxaparin (LOVENOX) injection 40 mg (not administered)  0.9 %  sodium chloride infusion ( Intravenous New Bag/Given 05/13/17 2243)  piperacillin-tazobactam (ZOSYN) IVPB 3.375 g (not administered)  acetaminophen (TYLENOL)  tablet 1,000 mg (not administered)  oxyCODONE (Oxy IR/ROXICODONE) immediate release tablet 5-10 mg (not administered)  morphine 4 MG/ML injection 2 mg (not administered)  ondansetron (ZOFRAN-ODT) disintegrating tablet 4 mg (not administered)    Or  ondansetron (ZOFRAN) injection 4 mg (not administered)  simethicone (MYLICON) chewable tablet 40 mg (not administered)  hydrALAZINE (APRESOLINE) injection 10 mg (not administered)  HYDROmorphone (DILAUDID) 1 MG/ML injection (not administered)  sodium chloride 0.9 % bolus 500 mL (500 mLs Intravenous New Bag/Given 05/13/17 1809)  piperacillin-tazobactam (ZOSYN) IVPB 3.375 g (0 g Intravenous Stopped 05/13/17 1926)  morphine 4 MG/ML injection 4 mg (4 mg Intravenous Given 05/13/17 1809)  ondansetron (ZOFRAN) injection 4 mg (4 mg Intravenous Given 05/13/17 1809)     NEW OUTPATIENT MEDICATIONS STARTED DURING THIS VISIT:  None   Note:  This document was prepared using Dragon voice recognition software and may include unintentional dictation errors.  Nanda Quinton, MD Emergency Medicine   Townes Fuhs, [facility], MD 05/14/17 Benancio Deeds

## 2017-05-13 NOTE — Transfer of Care (Signed)
Immediate Anesthesia Transfer of Care Note  Patient: Amanda Brewer  Procedure(s) Performed: Procedure(s): APPENDECTOMY LAPAROSCOPIC (N/A)  Patient Location: PACU  Anesthesia Type:General  Level of Consciousness: awake, alert  and oriented  Airway & Oxygen Therapy: Patient Spontanous Breathing  Post-op Assessment: Report given to RN and Post -op Vital signs reviewed and stable  Post vital signs: Reviewed and stable  Last Vitals:  Vitals:   05/13/17 1951 05/13/17 2000  BP: 128/76 133/79  Pulse: 80 84  Resp: 17 16  Temp:    SpO2: 95% 95%    Last Pain:  Vitals:   05/13/17 2022  TempSrc:   PainSc: 3          Complications: No apparent anesthesia complications

## 2017-05-13 NOTE — Anesthesia Preprocedure Evaluation (Addendum)
Anesthesia Evaluation  Patient identified by MRN, date of birth, ID band Patient awake    Reviewed: Allergy & Precautions, H&P , NPO status , Patient's Chart, lab work & pertinent test results  Airway Mallampati: II  TM Distance: >3 FB Neck ROM: Full    Dental no notable dental hx. (+) Teeth Intact, Dental Advisory Given   Pulmonary neg pulmonary ROS,    Pulmonary exam normal breath sounds clear to auscultation       Cardiovascular negative cardio ROS   Rhythm:Regular Rate:Normal     Neuro/Psych negative neurological ROS  negative psych ROS   GI/Hepatic negative GI ROS, Neg liver ROS,   Endo/Other  negative endocrine ROS  Renal/GU negative Renal ROS  negative genitourinary   Musculoskeletal  (+) Arthritis , Rheumatoid disorders,    Abdominal   Peds  Hematology negative hematology ROS (+)   Anesthesia Other Findings   Reproductive/Obstetrics negative OB ROS                            Anesthesia Physical Anesthesia Plan  ASA: II and emergent  Anesthesia Plan: General   Post-op Pain Management:    Induction: Intravenous, Rapid sequence and Cricoid pressure planned  PONV Risk Score and Plan: 4 or greater and Ondansetron, Dexamethasone and Midazolam  Airway Management Planned: Oral ETT  Additional Equipment:   Intra-op Plan:   Post-operative Plan: Extubation in OR  Informed Consent: I have reviewed the patients History and Physical, chart, labs and discussed the procedure including the risks, benefits and alternatives for the proposed anesthesia with the patient or authorized representative who has indicated his/her understanding and acceptance.   Dental advisory given  Plan Discussed with: CRNA  Anesthesia Plan Comments:         Anesthesia Quick Evaluation

## 2017-05-13 NOTE — ED Provider Notes (Signed)
[facility]    CSN: [id number] Arrival date & time: 05/13/17  1112     History   Chief Complaint Chief Complaint  Patient presents with  . Abdominal Pain    HPI Amanda Brewer is a 56 y.o. female.   HPI  Amanda Brewer is a 56 y.o. female presenting to UC with c/o intermittent generalized abdominal pain that is cramping and sharp at times, associated nausea for 3 days.  Last night and this morning she had a normal bowel movement then an episode of watery diarrhea this morning.  She also had dry heaves this morning but has not had anything to eat last night or this morning due to nausea. She has been drinking water. Mild dysuria and bladder pressure. Dark colored urine.  She did start a new diet free of dairy, gluten, soy, corn, and peanut for about 3 weeks as recommended to her husband by his chiropractor.  She was also recently diagnosed with RA, she takes Humira and Plaquenil.  She does have known gallstones, found about 3-4 months ago via ultrasound. Was advised to monitor symptoms.  Denies fever, chills or vomiting.    Past Medical History:  Diagnosis Date  . Arthritis   . Gallstones   . Kidney stone   . Seasonal allergies     There are no active problems to display for this patient.   Past Surgical History:  Procedure Laterality Date  . DILATION AND CURETTAGE OF UTERUS    . TUBAL LIGATION      OB History    No data available       Home Medications    Prior to Admission medications   Medication Sig Start Date End Date Taking? Authorizing Provider  Adalimumab (HUMIRA) 40 MG/0.4ML PSKT Inject into the skin.   Yes [provider]  aspirin 81 MG chewable tablet Chew by mouth daily.   Yes [provider]  atorvastatin (LIPITOR) 10 MG tablet Take 10 mg by mouth daily.   Yes [provider]  Calcium Carb-Cholecalciferol (CALCIUM 600 + D) 600-200 MG-UNIT TABS Take by mouth.   Yes [provider]  hydroxychloroquine (PLAQUENIL) 200  MG tablet Take by mouth daily.   Yes [provider]  montelukast (SINGULAIR) 10 MG tablet Take 10 mg by mouth at bedtime.   Yes [provider]  Multiple Vitamin (MULTI-VITAMIN PO) Take by mouth.   Yes [provider]  prednisoLONE 5 MG TABS tablet Take by mouth.   Yes [provider]  sertraline (ZOLOFT) 25 MG tablet Take 25 mg by mouth daily.   Yes [provider]    Family History Family History  Problem Relation Age of Onset  . Hypertension Mother   . Osteoarthritis Mother   . Heart attack Father   . Stroke Father   . Alzheimer's disease Father   . Hypertension Father   . Heart attack Brother     Social History Social History  Substance Use Topics  . Smoking status: Never Smoker  . Smokeless tobacco: Never Used  . Alcohol use No     Allergies   Patient has no known allergies.   Review of Systems Review of Systems  Constitutional: Positive for appetite change. Negative for chills and fever.  Gastrointestinal: Positive for abdominal pain, diarrhea and nausea. Negative for blood in stool and vomiting.  Genitourinary: Positive for dysuria, hematuria (possibly? dark urine) and pelvic pain (bladder pressure). Negative for frequency, menstrual problem and urgency.  Physical Exam Triage Vital Signs ED Triage Vitals  Enc Vitals Group     BP 05/13/17 1145 101/67     Pulse Rate 05/13/17 1145 89     Resp 05/13/17 1145 16     Temp 05/13/17 1145 98.9 F (37.2 C)     Temp Source 05/13/17 1145 Oral     SpO2 05/13/17 1145 97 %     Weight 05/13/17 1146 157 lb (71.2 kg)     Height --      Head Circumference --      Peak Flow --      Pain Score 05/13/17 1146 7     Pain Loc --      Pain Edu? --      Excl. in [place]? --    No data found.   Updated Vital Signs BP 101/67 (BP Location: Left Arm)   Pulse 89   Temp 98.9 F (37.2 C) (Oral)   Resp 16   Wt 157 lb (71.2 kg)   SpO2 97%      Physical Exam  Constitutional: She  is oriented to person, place, and time. She appears well-developed and well-nourished. She appears distressed.  Lying on Left side in exam bed, appears mildly uncomfortable but alert and cooperative during exam.   HENT:  Head: Normocephalic and atraumatic.  Mouth/Throat: Oropharynx is clear and moist.  Eyes: EOM are normal.  Neck: Normal range of motion.  Cardiovascular: Normal rate and regular rhythm.   Pulmonary/Chest: Effort normal and breath sounds normal. No respiratory distress. She has no wheezes. She has no rales.  Abdominal: Soft. Bowel sounds are normal. She exhibits no distension and no mass. There is tenderness. There is rebound. There is no guarding.  Diffuse intermittent tenderness. No localized tenderness.  Musculoskeletal: Normal range of motion.  Neurological: She is alert and oriented to person, place, and time.  Skin: Skin is warm and dry. She is not diaphoretic.  Psychiatric: She has a normal mood and affect. Her behavior is normal.  Nursing note and vitals reviewed.    UC Treatments / Results  Labs (all labs ordered are listed, but only abnormal results are displayed) Labs Reviewed  POCT URINALYSIS DIP (MANUAL ENTRY) - Abnormal; Notable for the following:       Result Value   Bilirubin, UA small (*)    Ketones, POC UA moderate (40) (*)    Spec Grav, UA >=1.030 (*)    Protein Ur, POC =100 (*)    Nitrite, UA Positive (*)    All other components within normal limits  URINE CULTURE  COMPLETE METABOLIC PANEL WITH GFR  LIPASE  POCT CBC W AUTO DIFF ([facility])    EKG  EKG Interpretation None       Radiology Ct Abdomen Pelvis W Contrast  Result Date: 05/13/2017 CLINICAL DATA:  Abdominal pain, nausea for 3 days EXAM: CT ABDOMEN AND PELVIS WITH CONTRAST TECHNIQUE: Multidetector CT imaging of the abdomen and pelvis was performed using the standard protocol following bolus administration of intravenous contrast. CONTRAST:  158mL ISOVUE-300 IOPAMIDOL  (ISOVUE-300) INJECTION 61% COMPARISON:  None. FINDINGS: Lower chest: Lung bases demonstrate no acute consolidation or effusion. Normal heart size. Hepatobiliary: Large calcified gallstones. No focal hepatic abnormality. No biliary dilatation Pancreas: Unremarkable. No pancreatic ductal dilatation or surrounding inflammatory changes. Spleen: Normal in size without focal abnormality. Adrenals/Urinary Tract: Adrenal glands are within normal limits. Kidneys show no hydronephrosis. There is a large 6.1 cm cyst in the lower pole left  kidney. Immediately adjacent to this is a 4 cm solid-appearing renal mass. The bladder is unremarkable Stomach/Bowel: Stomach is nonenlarged. No dilated small bowel. Enlarged appendix measuring up to 1 cm with 6 mm appendicolith. Moderate surrounding inflammation compatible with acute appendicitis. There is free fluid in the pelvis and significant inflammatory change in the periappendiceal mesentery. There is wall thickening and inflammatory change of the adjacent distal ileal bowel loops. Vascular/Lymphatic: Aortic atherosclerosis. No enlarged abdominal or pelvic lymph nodes. Reproductive: Uterus and bilateral adnexa are unremarkable. Bilateral tubal ligation clips Other: Negative for free air.  Small fat in the umbilicus. Musculoskeletal: Degenerative changes. No acute or suspicious bone lesion IMPRESSION: 1. Findings are consistent with an acute appendicitis. Free-fluid and moderate inflammatory changes surrounding the appendix raise possibility of perforation, however no extraluminal gas collection or free air is seen. Thickening of adjacent distal small bowel loops is likely reactive. Negative for a bowel obstruction. 2. 4 cm solid-appearing mass in the lower pole of the left kidney. Further evaluation with nonemergent renal CT or MRI recommended. 3. Large gallstones Critical Value/emergent results were called by telephone at the time of interpretation on 05/13/2017 at 3:10 pm to Dr. Leeroy Cha , who verbally acknowledged these results. Electronically Signed   By: Donavan Foil M.D.   On: 05/13/2017 15:10    Procedures Procedures (including critical care time)  Medications Ordered in UC Medications  ondansetron (ZOFRAN-ODT) disintegrating tablet 4 mg (4 mg Oral Given 05/13/17 1140)     Initial Impression / Assessment and Plan / UC Course  I have reviewed the triage vital signs and the nursing notes.  Pertinent labs & imaging results that were available during my care of the patient were reviewed by me and considered in my medical decision making (see chart for details).     Diffuse intermittent abdominal pain with nausea for 3 days, and 1 episode of diarrhea this morning. Diffuse intermittent tenderness during exam. No localized tenderness.  UA: possible UTI   DDx: UTI, diverticulitis, possible cholecystitis given hx of stones.   Imaging concerning for appendicitis with possible perforation. Discussed imaging with pt and husband. Advised to go to emergency department. Pt has been to [facility] in the past but would prefer to go to [facility].  EMS stated it would be at least 20 minutes before they could get to [facility].  Husband requested to take pt POV to [facility]. Notified charge nurse and on-call surgeon, Dr. Barry Dienes pt is en-route to [facility].   Final Clinical Impressions(s) / UC Diagnoses   Final diagnoses:  Generalized abdominal pain  Acute appendicitis, unspecified acute appendicitis type  Left renal mass  Gallstones    New Prescriptions Discharge Medication List as of 05/13/2017  3:16 PM       Controlled Substance Prescriptions [place] Controlled Substance Registry consulted? Not Applicable   Tyrell Antonio 05/13/17 1544

## 2017-05-14 ENCOUNTER — Encounter (HOSPITAL_COMMUNITY): Payer: Self-pay | Admitting: General Surgery

## 2017-05-14 LAB — BASIC METABOLIC PANEL
Anion gap: 10 (ref 5–15)
BUN: 11 mg/dL (ref 6–20)
CO2: 21 mmol/L — ABNORMAL LOW (ref 22–32)
Calcium: 8.3 mg/dL — ABNORMAL LOW (ref 8.9–10.3)
Chloride: 110 mmol/L (ref 101–111)
Creatinine, Ser: 0.76 mg/dL (ref 0.44–1.00)
GFR calc Af Amer: >60 mL/min (ref >60)
GFR calc non Af Amer: >60 mL/min (ref >60)
Glucose, Bld: 98 mg/dL (ref 65–99)
Potassium: 3.2 mmol/L — ABNORMAL LOW (ref 3.5–5.1)
Sodium: 141 mmol/L (ref 135–145)

## 2017-05-14 LAB — URINE CULTURE
MICRO NUMBER:: 80986769
Result:: NO GROWTH
SPECIMEN QUALITY:: ADEQUATE

## 2017-05-14 LAB — COMPLETE METABOLIC PANEL WITH GFR
AG Ratio: 1.9 (calc) (ref 1.0–2.5)
ALT: 27 U/L (ref 6–29)
AST: 33 U/L (ref 10–35)
Albumin: 3.7 g/dL (ref 3.6–5.1)
Alkaline phosphatase (APISO): 97 U/L (ref 33–130)
BUN: 12 mg/dL (ref 7–25)
CO2: 29 mmol/L (ref 20–32)
Calcium: 9.2 mg/dL (ref 8.6–10.4)
Chloride: 103 mmol/L (ref 98–110)
Creat: 0.6 mg/dL (ref 0.50–1.05)
GFR, Est African American: 119 mL/min/{1.73_m2} (ref >=60)
GFR, Est Non African American: 103 mL/min/{1.73_m2} (ref >=60)
Globulin: 1.9 g/dL (calc) (ref 1.9–3.7)
Glucose, Bld: 98 mg/dL (ref 65–99)
Potassium: 3.7 mmol/L (ref 3.5–5.3)
Sodium: 138 mmol/L (ref 135–146)
Total Bilirubin: 1.5 mg/dL — ABNORMAL HIGH (ref 0.2–1.2)
Total Protein: 5.6 g/dL — ABNORMAL LOW (ref 6.1–8.1)

## 2017-05-14 LAB — CBC
HCT: 34.7 % — ABNORMAL LOW (ref 36.0–46.0)
Hemoglobin: 11.5 g/dL — ABNORMAL LOW (ref 12.0–15.0)
MCH: 29 pg (ref 26.0–34.0)
MCHC: 33.1 g/dL (ref 30.0–36.0)
MCV: 87.6 fL (ref 78.0–100.0)
Platelets: 104 10*3/uL — ABNORMAL LOW (ref 150–400)
RBC: 3.96 MIL/uL (ref 3.87–5.11)
RDW: 13.4 % (ref 11.5–15.5)
WBC: 11 10*3/uL — ABNORMAL HIGH (ref 4.0–10.5)

## 2017-05-14 LAB — LIPASE: Lipase: 9 U/L (ref 7–60)

## 2017-05-14 MED ORDER — KCL IN DEXTROSE-NACL 20-5-0.9 MEQ/L-%-% IV SOLN
INTRAVENOUS | Status: DC
  Administered 2017-05-14 – 2017-05-17 (×5): via INTRAVENOUS
  Filled 2017-05-14 (×8): qty 1000

## 2017-05-14 NOTE — Progress Notes (Signed)
Pt ambulated a few steps outside her room this pm

## 2017-05-14 NOTE — Progress Notes (Signed)
Pt was able to void in bathroom.

## 2017-05-14 NOTE — Progress Notes (Signed)
1 Day Post-Op   Subjective/Chief Complaint: Comfortable Not voiding yet Denies nausea No flatus yet   Objective: Vital signs in last 24 hours: Temp:  [98.2 F (36.8 C)-100.2 F (37.9 C)] 98.2 F (36.8 C) (09/08 0400) Pulse Rate:  [70-98] 80 (09/08 0400) Resp:  [6-20] 12 (09/08 0400) BP: (101-153)/(64-79) 136/69 (09/08 0400) SpO2:  [95 %-100 %] 99 % (09/08 0400) Weight:  [71.2 kg (157 lb)-76.7 kg (169 lb 1.6 oz)] 76.7 kg (169 lb 1.6 oz) (09/07 2335) Last BM Date: 05/13/17  Intake/Output from previous day: 09/07 0701 - 09/08 0700 In: 2118.3 [P.O.:190; I.V.:1828.3; IV Piggyback:100] Out: 255 [Urine:100; Drains:105; Blood:50] Intake/Output this shift: No intake/output data recorded.  Exam: Looks comfortable Abdomen soft, non-distended, drain serosang  Lab Results:   Recent Labs  05/13/17 1750 05/14/17 0359  WBC 12.9* 11.0*  HGB 14.2 11.5*  HCT 41.1 34.7*  PLT 116* 104*   BMET  Recent Labs  05/13/17 1620 05/14/17 0359  NA 138 141  K 3.4* 3.2*  CL 103 110  CO2 24 21*  GLUCOSE 99 98  BUN 8 11  CREATININE 0.71 0.76  CALCIUM 9.2 8.3*   PT/INR  Recent Labs  05/13/17 1750  LABPROT 13.6  INR 1.05   ABG No results for input(s): PHART, HCO3 in the last 72 hours.  Invalid input(s): PCO2, PO2  Studies/Results: Ct Abdomen Pelvis W Contrast  Result Date: 05/13/2017 CLINICAL DATA:  Abdominal pain, nausea for 3 days EXAM: CT ABDOMEN AND PELVIS WITH CONTRAST TECHNIQUE: Multidetector CT imaging of the abdomen and pelvis was performed using the standard protocol following bolus administration of intravenous contrast. CONTRAST:  170mL ISOVUE-300 IOPAMIDOL (ISOVUE-300) INJECTION 61% COMPARISON:  None. FINDINGS: Lower chest: Lung bases demonstrate no acute consolidation or effusion. Normal heart size. Hepatobiliary: Large calcified gallstones. No focal hepatic abnormality. No biliary dilatation Pancreas: Unremarkable. No pancreatic ductal dilatation or surrounding  inflammatory changes. Spleen: Normal in size without focal abnormality. Adrenals/Urinary Tract: Adrenal glands are within normal limits. Kidneys show no hydronephrosis. There is a large 6.1 cm cyst in the lower pole left kidney. Immediately adjacent to this is a 4 cm solid-appearing renal mass. The bladder is unremarkable Stomach/Bowel: Stomach is nonenlarged. No dilated small bowel. Enlarged appendix measuring up to 1 cm with 6 mm appendicolith. Moderate surrounding inflammation compatible with acute appendicitis. There is free fluid in the pelvis and significant inflammatory change in the periappendiceal mesentery. There is wall thickening and inflammatory change of the adjacent distal ileal bowel loops. Vascular/Lymphatic: Aortic atherosclerosis. No enlarged abdominal or pelvic lymph nodes. Reproductive: Uterus and bilateral adnexa are unremarkable. Bilateral tubal ligation clips Other: Negative for free air.  Small fat in the umbilicus. Musculoskeletal: Degenerative changes. No acute or suspicious bone lesion IMPRESSION: 1. Findings are consistent with an acute appendicitis. Free-fluid and moderate inflammatory changes surrounding the appendix raise possibility of perforation, however no extraluminal gas collection or free air is seen. Thickening of adjacent distal small bowel loops is likely reactive. Negative for a bowel obstruction. 2. 4 cm solid-appearing mass in the lower pole of the left kidney. Further evaluation with nonemergent renal CT or MRI recommended. 3. Large gallstones Critical Value/emergent results were called by telephone at the time of interpretation on 05/13/2017 at 3:10 pm to Dr. Leeroy Cha , who verbally acknowledged these results. Electronically Signed   By: Donavan Foil M.D.   On: 05/13/2017 15:10    Anti-infectives: Anti-infectives    Start     Dose/Rate Route Frequency Ordered Stop  05/13/17 2345  piperacillin-tazobactam (ZOSYN) IVPB 3.375 g     3.375 g 12.5 mL/hr over 240  Minutes Intravenous Every 8 hours 05/13/17 2339     05/13/17 1715  piperacillin-tazobactam (ZOSYN) IVPB 3.375 g     3.375 g 100 mL/hr over 30 Minutes Intravenous  Once 05/13/17 1711 05/13/17 1926      Assessment/Plan: s/p Procedure(s): APPENDECTOMY LAPAROSCOPIC (N/A)  Continue IV antibiotics Repeat labs in the am  LOS: 1 day    Amanda Brewer A 05/14/2017

## 2017-05-14 NOTE — Progress Notes (Signed)
Pt bladder scanned with 429cc noted. In and out catheter done and 600 removed from bladder.

## 2017-05-15 LAB — BASIC METABOLIC PANEL
Anion gap: 7 (ref 5–15)
BUN: 10 mg/dL (ref 6–20)
CO2: 25 mmol/L (ref 22–32)
Calcium: 7.9 mg/dL — ABNORMAL LOW (ref 8.9–10.3)
Chloride: 108 mmol/L (ref 101–111)
Creatinine, Ser: 0.74 mg/dL (ref 0.44–1.00)
GFR calc Af Amer: >60 mL/min (ref >60)
GFR calc non Af Amer: >60 mL/min (ref >60)
Glucose, Bld: 120 mg/dL — ABNORMAL HIGH (ref 65–99)
Potassium: 3.2 mmol/L — ABNORMAL LOW (ref 3.5–5.1)
Sodium: 140 mmol/L (ref 135–145)

## 2017-05-15 LAB — CBC
HCT: 28.1 % — ABNORMAL LOW (ref 36.0–46.0)
Hemoglobin: 9.4 g/dL — ABNORMAL LOW (ref 12.0–15.0)
MCH: 29.5 pg (ref 26.0–34.0)
MCHC: 33.5 g/dL (ref 30.0–36.0)
MCV: 88.1 fL (ref 78.0–100.0)
Platelets: 104 10*3/uL — ABNORMAL LOW (ref 150–400)
RBC: 3.19 MIL/uL — ABNORMAL LOW (ref 3.87–5.11)
RDW: 13.4 % (ref 11.5–15.5)
WBC: 6.6 10*3/uL (ref 4.0–10.5)

## 2017-05-15 MED ORDER — POTASSIUM CHLORIDE CRYS ER 20 MEQ PO TBCR
40.0000 meq | EXTENDED_RELEASE_TABLET | Freq: Two times a day (BID) | ORAL | Status: DC
  Administered 2017-05-15 – 2017-05-17 (×6): 40 meq via ORAL
  Filled 2017-05-15 (×6): qty 2

## 2017-05-15 NOTE — Progress Notes (Signed)
2 Days Post-Op   Subjective/Chief Complaint: Had some nausea this morning but passing flatus Slightly more RLQ pain   Objective: Vital signs in last 24 hours: Temp:  [97.5 F (36.4 C)-99.3 F (37.4 C)] 98.9 F (37.2 C) (09/09 0437) Pulse Rate:  [72-79] 79 (09/09 0437) Resp:  [12-14] 14 (09/09 0437) BP: (105-114)/(49-60) 114/60 (09/09 0437) SpO2:  [96 %-98 %] 98 % (09/09 0437) Last BM Date: 05/13/17  Intake/Output from previous day: 09/08 0701 - 09/09 0700 In: 951.7 [P.O.:360; I.V.:491.7; IV Piggyback:100] Out: 665 [Urine:500; Drains:165] Intake/Output this shift: No intake/output data recorded.  Exam: Looks comfortable Abdomen soft, drain with dark bloody fluid  Lab Results:   Recent Labs  05/14/17 0359 05/15/17 0516  WBC 11.0* 6.6  HGB 11.5* 9.4*  HCT 34.7* 28.1*  PLT 104* 104*   BMET  Recent Labs  05/14/17 0359 05/15/17 0516  NA 141 140  K 3.2* 3.2*  CL 110 108  CO2 21* 25  GLUCOSE 98 120*  BUN 11 10  CREATININE 0.76 0.74  CALCIUM 8.3* 7.9*   PT/INR  Recent Labs  05/13/17 1750  LABPROT 13.6  INR 1.05   ABG No results for input(s): PHART, HCO3 in the last 72 hours.  Invalid input(s): PCO2, PO2  Studies/Results: Ct Abdomen Pelvis W Contrast  Result Date: 05/13/2017 CLINICAL DATA:  Abdominal pain, nausea for 3 days EXAM: CT ABDOMEN AND PELVIS WITH CONTRAST TECHNIQUE: Multidetector CT imaging of the abdomen and pelvis was performed using the standard protocol following bolus administration of intravenous contrast. CONTRAST:  176mL ISOVUE-300 IOPAMIDOL (ISOVUE-300) INJECTION 61% COMPARISON:  None. FINDINGS: Lower chest: Lung bases demonstrate no acute consolidation or effusion. Normal heart size. Hepatobiliary: Large calcified gallstones. No focal hepatic abnormality. No biliary dilatation Pancreas: Unremarkable. No pancreatic ductal dilatation or surrounding inflammatory changes. Spleen: Normal in size without focal abnormality. Adrenals/Urinary  Tract: Adrenal glands are within normal limits. Kidneys show no hydronephrosis. There is a large 6.1 cm cyst in the lower pole left kidney. Immediately adjacent to this is a 4 cm solid-appearing renal mass. The bladder is unremarkable Stomach/Bowel: Stomach is nonenlarged. No dilated small bowel. Enlarged appendix measuring up to 1 cm with 6 mm appendicolith. Moderate surrounding inflammation compatible with acute appendicitis. There is free fluid in the pelvis and significant inflammatory change in the periappendiceal mesentery. There is wall thickening and inflammatory change of the adjacent distal ileal bowel loops. Vascular/Lymphatic: Aortic atherosclerosis. No enlarged abdominal or pelvic lymph nodes. Reproductive: Uterus and bilateral adnexa are unremarkable. Bilateral tubal ligation clips Other: Negative for free air.  Small fat in the umbilicus. Musculoskeletal: Degenerative changes. No acute or suspicious bone lesion IMPRESSION: 1. Findings are consistent with an acute appendicitis. Free-fluid and moderate inflammatory changes surrounding the appendix raise possibility of perforation, however no extraluminal gas collection or free air is seen. Thickening of adjacent distal small bowel loops is likely reactive. Negative for a bowel obstruction. 2. 4 cm solid-appearing mass in the lower pole of the left kidney. Further evaluation with nonemergent renal CT or MRI recommended. 3. Large gallstones Critical Value/emergent results were called by telephone at the time of interpretation on 05/13/2017 at 3:10 pm to Dr. Leeroy Cha , who verbally acknowledged these results. Electronically Signed   By: Donavan Foil M.D.   On: 05/13/2017 15:10    Anti-infectives: Anti-infectives    Start     Dose/Rate Route Frequency Ordered Stop   05/13/17 2345  piperacillin-tazobactam (ZOSYN) IVPB 3.375 g     3.375  g 12.5 mL/hr over 240 Minutes Intravenous Every 8 hours 05/13/17 2339     05/13/17 1715  piperacillin-tazobactam  (ZOSYN) IVPB 3.375 g     3.375 g 100 mL/hr over 30 Minutes Intravenous  Once 05/13/17 1711 05/13/17 1926      Assessment/Plan: s/p Procedure(s): APPENDECTOMY LAPAROSCOPIC (N/A)  Continuing IV antibiotics.  WBC normal HGB down.  Will hold lovenox and repeat later today  LOS: 2 days    Rhys Anchondo A 05/15/2017

## 2017-05-16 LAB — CBC
HCT: 28.3 % — ABNORMAL LOW (ref 36.0–46.0)
Hemoglobin: 9.2 g/dL — ABNORMAL LOW (ref 12.0–15.0)
MCH: 29 pg (ref 26.0–34.0)
MCHC: 32.5 g/dL (ref 30.0–36.0)
MCV: 89.3 fL (ref 78.0–100.0)
Platelets: 110 10*3/uL — ABNORMAL LOW (ref 150–400)
RBC: 3.17 MIL/uL — ABNORMAL LOW (ref 3.87–5.11)
RDW: 13.5 % (ref 11.5–15.5)
WBC: 6 10*3/uL (ref 4.0–10.5)

## 2017-05-16 LAB — BASIC METABOLIC PANEL
Anion gap: 4 — ABNORMAL LOW (ref 5–15)
BUN: 6 mg/dL (ref 6–20)
CO2: 26 mmol/L (ref 22–32)
Calcium: 8.2 mg/dL — ABNORMAL LOW (ref 8.9–10.3)
Chloride: 111 mmol/L (ref 101–111)
Creatinine, Ser: 0.68 mg/dL (ref 0.44–1.00)
GFR calc Af Amer: >60 mL/min (ref >60)
GFR calc non Af Amer: >60 mL/min (ref >60)
Glucose, Bld: 84 mg/dL (ref 65–99)
Potassium: 4.3 mmol/L (ref 3.5–5.1)
Sodium: 141 mmol/L (ref 135–145)

## 2017-05-16 MED ORDER — SACCHAROMYCES BOULARDII 250 MG PO CAPS
250.0000 mg | ORAL_CAPSULE | Freq: Two times a day (BID) | ORAL | Status: DC
  Administered 2017-05-16 – 2017-05-17 (×4): 250 mg via ORAL
  Filled 2017-05-16 (×4): qty 1

## 2017-05-16 NOTE — Progress Notes (Signed)
3 Days Post-Op    CC:  acute appendicitis  Subjective: She is smiling and feels better. Tolerating clears well. Having some loose stools. Port sites look good.the drainage from the JP is bloody but H&H is stable.   Objective: Vital signs in last 24 hours: Temp:  [98.4 F (36.9 C)-98.5 F (36.9 C)] 98.4 F (36.9 C) (09/09 2141) Pulse Rate:  [73-84] 73 (09/09 2141) Resp:  [14-16] 16 (09/09 2141) BP: (116-122)/(53-58) 122/58 (09/09 2141) SpO2:  [97 %] 97 % (09/09 2141) Last BM Date: 05/16/17 110 PO 1125 urine IV not recorded Stool x2 yesterday and x 1 today Afebrile,    VSS  K+ 4.3, WBC 6.0 UTI  ?? Intake/Output from previous day: 09/09 0701 - 09/10 0700 In: 110 [P.O.:110] Out: 9485 [Urine:1125; Drains:70] Intake/Output this shift: Total I/O In: -  Out: 480 [Urine:450; Drains:30]  General appearance: alert, cooperative and no distress Resp: clear to auscultation bilaterally GI: soft, sore, port sites look good,drainage is still bloody appearing.  Lab Results:   Recent Labs  05/15/17 0516 05/16/17 0407  WBC 6.6 6.0  HGB 9.4* 9.2*  HCT 28.1* 28.3*  PLT 104* 110*    BMET  Recent Labs  05/15/17 0516 05/16/17 0407  NA 140 141  K 3.2* 4.3  CL 108 111  CO2 25 26  GLUCOSE 120* 84  BUN 10 6  CREATININE 0.74 0.68  CALCIUM 7.9* 8.2*   PT/INR  Recent Labs  05/13/17 1750  LABPROT 13.6  INR 1.05     Recent Labs Lab 05/13/17 1201  AST 33  ALT 27  BILITOT 1.5*  PROT 5.6*     Lipase     Component Value Date/Time   LIPASE 26 05/13/2017 1620     Prior to Admission medications   Medication Sig Start Date End Date Taking? Authorizing Provider  aspirin 81 MG chewable tablet Chew by mouth daily.   Yes [provider]  atorvastatin (LIPITOR) 10 MG tablet Take 10 mg by mouth daily.   Yes [provider]  Calcium Carb-Cholecalciferol (CALCIUM 600 + D) 600-200 MG-UNIT TABS Take by mouth.   Yes [provider]  HUMIRA PEN 40  MG/0.8ML PNKT Inject 40 mg as directed every 14 (fourteen) days. 05/06/17  Yes [provider]  hydroxychloroquine (PLAQUENIL) 200 MG tablet Take 200 mg by mouth 2 (two) times daily.    Yes [provider]  montelukast (SINGULAIR) 10 MG tablet Take 10 mg by mouth at bedtime.   Yes [provider]  Multiple Vitamin (MULTI-VITAMIN PO) Take by mouth.   Yes [provider]  OVER THE COUNTER MEDICATION daily. Relive Nutritional Supplement   Yes [provider]  prednisoLONE 5 MG TABS tablet Take by mouth.   Yes [provider]  sertraline (ZOLOFT) 25 MG tablet Take 12.5 mg by mouth daily.    Yes [provider]    Medications: . acetaminophen  1,000 mg Oral Q6H  . montelukast  10 mg Oral QHS  . potassium chloride  40 mEq Oral BID  . prednisoLONE  5 mg Oral Daily  . sertraline  12.5 mg Oral Daily   . dextrose 5 % and 0.9 % NaCl with KCl 20 mEq/L 50 mL/hr at 05/16/17 1015  . piperacillin-tazobactam (ZOSYN)  IV Stopped (05/16/17 0458)   Anti-infectives    Start     Dose/Rate Route Frequency Ordered Stop   05/13/17 2345  piperacillin-tazobactam (ZOSYN) IVPB 3.375 g     3.375 g  12.5 mL/hr over 240 Minutes Intravenous Every 8 hours 05/13/17 2339     05/13/17 1715  piperacillin-tazobactam (ZOSYN) IVPB 3.375 g     3.375 g 100 mL/hr over 30 Minutes Intravenous  Once 05/13/17 1711 05/13/17 1926     Assessment/Plan Perforated appendicitis Lap scopic appendectomy with drain placement,  05/13/17, Dr. Rolm Bookbinder Arthritis - on Humira, Plaquenil, and prednisone Gallstones FEN:  IV fluids/clears ID:  Zosyn 9/7 =>>day 4 DVT:  SCD's only - Platelets 116-104-104-110  Plan: Advance to full liquids, probiotics, continue antibiotics for at least 5 days. Continue to mobilize. Continue to watch CBC closely.             LOS: 3 days    Nike Southers 05/16/2017 [phone]

## 2017-05-17 LAB — URINALYSIS, ROUTINE W REFLEX MICROSCOPIC
Bilirubin Urine: NEGATIVE
Glucose, UA: NEGATIVE mg/dL
Hgb urine dipstick: NEGATIVE
Ketones, ur: NEGATIVE mg/dL
Leukocytes, UA: NEGATIVE
Nitrite: NEGATIVE
Protein, ur: NEGATIVE mg/dL
Specific Gravity, Urine: 1.008 (ref 1.005–1.030)
pH: 7 (ref 5.0–8.0)

## 2017-05-17 LAB — CBC
HCT: 29.6 % — ABNORMAL LOW (ref 36.0–46.0)
Hemoglobin: 9.7 g/dL — ABNORMAL LOW (ref 12.0–15.0)
MCH: 29.1 pg (ref 26.0–34.0)
MCHC: 32.8 g/dL (ref 30.0–36.0)
MCV: 88.9 fL (ref 78.0–100.0)
Platelets: 139 10*3/uL — ABNORMAL LOW (ref 150–400)
RBC: 3.33 MIL/uL — ABNORMAL LOW (ref 3.87–5.11)
RDW: 13.6 % (ref 11.5–15.5)
WBC: 6.2 10*3/uL (ref 4.0–10.5)

## 2017-05-17 NOTE — Progress Notes (Signed)
Physical Therapy Evaluation and Discharge Patient Details Name: Amanda Brewer MRN: [id number] DOB: [birth year] Today's Date: 05/17/2017   History of Present Illness  Pt is a 56 yo female s/p laparoscopic Appendectomy (05/13/17).  Clinical Impression  Patient admitted with above diagnosis and requested PT in order to ensure ability to enter home which requires 3 flights of stairs to enter. Patient presents with deficits in functional mobility and increased pain, patient also displays increased guarding during gait and stairs in an attempt to protect abdominal incision and manage pain. Pt is mod I with all activities and is safe to d/c home from mobility standpoint. Husband will be available for supervision upon discharge. PT signing off, please reconsult if needs change.    Follow Up Recommendations No PT follow up    Equipment Recommendations  None recommended by PT    Recommendations for Other Services       Precautions / Restrictions Precautions Precautions: None Restrictions Weight Bearing Restrictions: No      Mobility  Bed Mobility Overal bed mobility: Modified Independent                Transfers Overall transfer level: Modified independent Equipment used: None                Ambulation/Gait Ambulation/Gait assistance: Modified independent (Device/Increase time) Ambulation Distance (Feet): 200 Feet Assistive device: None Gait Pattern/deviations: Decreased step length - right;Decreased step length - left;Decreased stride length;Step-through pattern Gait velocity: decreased Gait velocity interpretation: Below normal speed for age/gender General Gait Details: Patient ambulated with decreased speed and slight guarding to protect straining abdominal incision.  Stairs Stairs: Yes Stairs assistance: Supervision Stair Management: One rail Right;One rail Left Number of Stairs: 14 General stair comments: Patient began with a step to pattern to assess confidence on  stairs and ended with step through pattern. Patient had some L Lower abdominal pain with continued climbing and engagement of core.  Wheelchair Mobility    Modified Rankin (Stroke Patients Only)       Balance Overall balance assessment: Modified Independent                                           Pertinent Vitals/Pain Pain Assessment: Faces Faces Pain Scale: Hurts little more Pain Location: Lower Left abdomen Pain Descriptors / Indicators: Discomfort;Aching;Tightness Pain Intervention(s): Limited activity within patient's tolerance;Monitored during session    Home Living Family/patient expects to be discharged to:: Private residence Living Arrangements: Spouse/significant other (Husband is disabled and not working.) Available Help at Discharge: Family (Limited help from husband due to his disability.) Type of Home: Apartment Home Access: Stairs to enter Entrance Stairs-Rails: Can reach both Entrance Stairs-Number of Steps: 3 flights Home Layout: One level Home Equipment: Environmental consultant - 2 wheels;Cane - single point;Shower seat;Toilet riser;Hand held shower head Additional Comments: DME belong to husband.    Prior Function Level of Independence: Independent         Comments: Also assists husband with ADL's such as cooking and laundry.     Hand Dominance   Dominant Hand: Right    Extremity/Trunk Assessment   Upper Extremity Assessment Upper Extremity Assessment: Defer to OT evaluation    Lower Extremity Assessment Lower Extremity Assessment: Overall WFL for tasks assessed    Cervical / Trunk Assessment Cervical / Trunk Assessment: Normal  Communication   Communication: No difficulties  Cognition Arousal/Alertness: Awake/alert Behavior During  Therapy: WFL for tasks assessed/performed Overall Cognitive Status: Within Functional Limits for tasks assessed                                        General Comments      Exercises      Assessment/Plan    PT Assessment Patient needs continued PT services  PT Problem List Decreased activity tolerance;Decreased mobility;Pain       PT Treatment Interventions (N/A)    PT Goals (Current goals can be found in the Care Plan section)  Acute Rehab PT Goals Patient Stated Goal: Go back home and resume ADL's PT Goal Formulation: With patient Time For Goal Achievement: 05/31/17 Potential to Achieve Goals: Good      Barriers to discharge        Co-evaluation               AM-PAC PT "6 Clicks" Daily Activity  Outcome Measure Difficulty turning over in bed (including adjusting bedclothes, sheets and blankets)?: None Difficulty moving from lying on back to sitting on the side of the bed? : None Difficulty sitting down on and standing up from a chair with arms (e.g., wheelchair, bedside commode, etc,.)?: None Help needed moving to and from a bed to chair (including a wheelchair)?: None Help needed walking in hospital room?: None Help needed climbing 3-5 steps with a railing? : A Little 6 Click Score: 23    End of Session Equipment Utilized During Treatment: Gait belt Activity Tolerance: Patient tolerated treatment well Patient left: in bed;with call bell/phone within reach Nurse Communication: Mobility status PT Visit Diagnosis: Other abnormalities of gait and mobility (R26.89)    Time:  -      Charges:         PT G Codes:        Amanda Brewer SPT  Navya Timmons 05/17/2017, 1:26 PM

## 2017-05-17 NOTE — Progress Notes (Signed)
Pt's husband says that pt will need a PT eval especially to do stairs as they live on a third floor apartment and husband wants to make sure she will be ok going up the stairs after discharge

## 2017-05-17 NOTE — Plan of Care (Signed)
Problem: Acute Rehab PT Goals(only PT should resolve) Goal: Pt Will Go Up/Down Stairs 3 flights

## 2017-05-17 NOTE — Progress Notes (Signed)
Patient ID: Amanda Brewer, female   DOB: [birth year], 56 y.o.   MRN: [id number]  [facility] Surgery Progress Note  4 Days Post-Op  Subjective: CC- perforated appendicitis States that she had a rough night. She was having significant lower abdominal pain and dysuria. Pain has since improved. She is tolerating solid food. Denies n/v.  Ambulating well in halls. Concerned because she has 3 flights of stairs at home, would like to work with PT prior to discharge.  Objective: Vital signs in last 24 hours: Temp:  [97.7 F (36.5 C)-98.5 F (36.9 C)] 98.5 F (36.9 C) (09/11 0437) Pulse Rate:  [73-94] 73 (09/11 0437) Resp:  [16] 16 (09/11 0437) BP: (120-140)/(57-75) 123/62 (09/11 0437) SpO2:  [96 %-99 %] 96 % (09/11 0437) Last BM Date: 05/16/17  Intake/Output from previous day: 09/10 0701 - 09/11 0700 In: 1835 [P.O.:660; I.V.:1075; IV Piggyback:100] Out: 3010 [Urine:2950; Drains:60] Intake/Output this shift: Total I/O In: -  Out: 920 [Urine:900; Drains:20]  PE: Gen:  Alert, NAD, pleasant HEENT: EOM's intact, pupils equal and round Pulm:  CTAB, no W/R/R, effort normal Abd: Soft, ND, mild global tenderness most significant in lower abdomen, +BS, incisions C/D/I, drain with minimal sanguinous drainage Psych: A&Ox3  Skin: no rashes noted, warm and dry  Lab Results:   Recent Labs  05/16/17 0407 05/17/17 0406  WBC 6.0 6.2  HGB 9.2* 9.7*  HCT 28.3* 29.6*  PLT 110* 139*   BMET  Recent Labs  05/15/17 0516 05/16/17 0407  NA 140 141  K 3.2* 4.3  CL 108 111  CO2 25 26  GLUCOSE 120* 84  BUN 10 6  CREATININE 0.74 0.68  CALCIUM 7.9* 8.2*   PT/INR No results for input(s): LABPROT, INR in the last 72 hours. CMP     Component Value Date/Time   NA 141 05/16/2017 0407   K 4.3 05/16/2017 0407   CL 111 05/16/2017 0407   CO2 26 05/16/2017 0407   GLUCOSE 84 05/16/2017 0407   BUN 6 05/16/2017 0407   CREATININE 0.68 05/16/2017 0407   CREATININE 0.60 05/13/2017 1201   CALCIUM 8.2 (L) 05/16/2017 0407   PROT 5.6 (L) 05/13/2017 1201   AST 33 05/13/2017 1201   ALT 27 05/13/2017 1201   BILITOT 1.5 (H) 05/13/2017 1201   GFRNONAA >60 05/16/2017 0407   GFRNONAA 103 05/13/2017 1201   GFRAA >60 05/16/2017 0407   GFRAA 119 05/13/2017 1201   Lipase     Component Value Date/Time   LIPASE 26 05/13/2017 1620       Studies/Results: No results found.  Anti-infectives: Anti-infectives    Start     Dose/Rate Route Frequency Ordered Stop   05/13/17 2345  piperacillin-tazobactam (ZOSYN) IVPB 3.375 g     3.375 g 12.5 mL/hr over 240 Minutes Intravenous Every 8 hours 05/13/17 2339     05/13/17 1715  piperacillin-tazobactam (ZOSYN) IVPB 3.375 g     3.375 g 100 mL/hr over 30 Minutes Intravenous  Once 05/13/17 1711 05/13/17 1926       Assessment/Plan Arthritis - on Humira, Plaquenil, and prednisone Gallstones   Perforated appendicitis Laparoscopic appendectomy with drain placement 05/13/17 Dr. Donne Hazel - POD 4 - high risk of abscess - drain with 60cc/24hr sanguinous fluid - tolerating soft diet  FEN:  decrease IVF, regular diet ID:  Zosyn 9/7>>day#5. Will need at least 1 week of antibiotics DVT:  SCD's only - Platelets 139  Plan: Consult PT. Continue ambulating. Check ua. CBC/BMP in AM. Continue drain.  LOS: 4 days    Wellington Hampshire , [facility] Surgery 05/17/2017, 10:32 AM Pager: [phone] Consults: [phone] Mon-Fri 7:00 am-4:30 pm Sat-Sun 7:00 am-11:30 am

## 2017-05-18 LAB — BASIC METABOLIC PANEL
Anion gap: 3 — ABNORMAL LOW (ref 5–15)
BUN: 9 mg/dL (ref 6–20)
CO2: 27 mmol/L (ref 22–32)
Calcium: 8.7 mg/dL — ABNORMAL LOW (ref 8.9–10.3)
Chloride: 109 mmol/L (ref 101–111)
Creatinine, Ser: 0.68 mg/dL (ref 0.44–1.00)
GFR calc Af Amer: >60 mL/min (ref >60)
GFR calc non Af Amer: >60 mL/min (ref >60)
Glucose, Bld: 99 mg/dL (ref 65–99)
Potassium: 4.9 mmol/L (ref 3.5–5.1)
Sodium: 139 mmol/L (ref 135–145)

## 2017-05-18 LAB — CBC
HCT: 28.3 % — ABNORMAL LOW (ref 36.0–46.0)
Hemoglobin: 9.2 g/dL — ABNORMAL LOW (ref 12.0–15.0)
MCH: 29 pg (ref 26.0–34.0)
MCHC: 32.5 g/dL (ref 30.0–36.0)
MCV: 89.3 fL (ref 78.0–100.0)
Platelets: 138 10*3/uL — ABNORMAL LOW (ref 150–400)
RBC: 3.17 MIL/uL — ABNORMAL LOW (ref 3.87–5.11)
RDW: 13.5 % (ref 11.5–15.5)
WBC: 5.1 10*3/uL (ref 4.0–10.5)

## 2017-05-18 MED ORDER — OXYCODONE HCL 5 MG PO TABS: 5.0000 mg | ORAL_TABLET | Freq: Four times a day (QID) | ORAL | 0 refills | Status: DC | PRN

## 2017-05-18 MED ORDER — AMOXICILLIN-POT CLAVULANATE 875-125 MG PO TABS: 1.0000 | ORAL_TABLET | Freq: Two times a day (BID) | ORAL | 0 refills | Status: AC

## 2017-05-18 NOTE — Progress Notes (Signed)
Patient read AVS in the presence of husband.  Both confirmed understanding information presented. Patient hard scripts given to husband.  JP drain discontinued.  Patient D/C to personal vehicle via WC.

## 2017-05-18 NOTE — Discharge Summary (Signed)
Physician Discharge Summary  Patient ID: Amanda Brewer MRN: [id number] DOB/AGE: 56/01/1961 56 y.o.  Admit date: 05/13/2017 Discharge date: 05/18/2017  Admission Diagnoses:  Discharge Diagnoses:  Active Problems:   Ruptured appendicitis   Discharged Condition: good  Hospital Course: uneventful post op recovery.  WBC slowly decreased on IV antibiotics.  Tolerated po.  Drainage in JP decreased. Discharge home POD#5  Consults: None  Significant Diagnostic Studies:   Treatments: surgery: lap appendectomy  Discharge Exam: Blood pressure 138/70, pulse 71, temperature 98.1 F (36.7 C), temperature source Oral, resp. rate 18, height 5\' 2"  (1.575 m), weight 76.7 kg (169 lb 1.6 oz), SpO2 100 %. General appearance: alert, cooperative and no distress Resp: clear to auscultation bilaterally Cardio: regular rate and rhythm, S1, S2 normal, no murmur, click, rub or gallop Incision/Wound:abdomen soft, drain serous  Disposition: 01-Home or Self Care  Discharge Instructions    Discharge patient    Complete by:  As directed    Discharge disposition:  01-Home or Self Care   Discharge patient date:  05/18/2017     Allergies as of 05/18/2017   No Known Allergies     Medication List    TAKE these medications   amoxicillin-clavulanate 875-125 MG tablet Commonly known as:  AUGMENTIN Take 1 tablet by mouth 2 (two) times daily.   aspirin 81 MG chewable tablet Chew by mouth daily.   atorvastatin 10 MG tablet Commonly known as:  LIPITOR Take 10 mg by mouth daily.   CALCIUM 600 + D 600-200 MG-UNIT Tabs Generic drug:  Calcium Carb-Cholecalciferol Take by mouth.   HUMIRA PEN 40 MG/0.8ML Pnkt Generic drug:  Adalimumab Inject 40 mg as directed every 14 (fourteen) days.   hydroxychloroquine 200 MG tablet Commonly known as:  PLAQUENIL Take 200 mg by mouth 2 (two) times daily.   montelukast 10 MG tablet Commonly known as:  SINGULAIR Take 10 mg by mouth at bedtime.   MULTI-VITAMIN PO Take  by mouth.   OVER THE COUNTER MEDICATION daily. Relive Nutritional Supplement   oxyCODONE 5 MG immediate release tablet Commonly known as:  Oxy IR/ROXICODONE Take 1-2 tablets (5-10 mg total) by mouth every 6 (six) hours as needed for moderate pain.   prednisoLONE 5 MG Tabs tablet Take by mouth.   sertraline 25 MG tablet Commonly known as:  ZOLOFT Take 12.5 mg by mouth daily.            Discharge Care Instructions        Start     Ordered   05/18/17 0000  oxyCODONE (OXY IR/ROXICODONE) 5 MG immediate release tablet  Every 6 hours PRN     05/18/17 0819   05/18/17 0000  amoxicillin-clavulanate (AUGMENTIN) 875-125 MG tablet  2 times daily     05/18/17 0819   05/18/17 0000  Discharge patient    Question Answer Comment  Discharge disposition 01-Home or Self Care   Discharge patient date 05/18/2017      05/18/17 2683     Follow-up Information    Rolm Bookbinder, MD. Call.   Specialty:  General Surgery Why:  Call when you leave the hospital to schedule a follow up appointment for 3-4 weeks.  Contact information: [place] [address] [phone]           Signed: Harl Bowie 05/18/2017, 8:19 AM

## 2017-05-18 NOTE — Discharge Instructions (Signed)

## 2017-05-18 NOTE — Progress Notes (Signed)
Patient ID: Amanda Brewer, female   DOB: 06-Oct-1960, 56 y.o.   MRN: [id number]   Feeling much better Wants to go home Afebrile Drainage down in JP Abdomen soft  Plan: Discharge home

## 2017-05-18 NOTE — Care Management Note (Signed)
Case Management Note  Patient Details  Name: Amanda Brewer MRN: [id number] Date of Birth: [birth year]  Subjective/Objective:                    Action/Plan:  No discharge needs identified. Expected Discharge Date:  05/18/17               Expected Discharge Plan:  Home/Self Care  In-House Referral:     Discharge planning Services     Post Acute Care Choice:    Choice offered to:     DME Arranged:    DME Agency:     HH Arranged:    HH Agency:     Status of Service:  Completed, signed off  If discussed at [employer] of Stay Meetings, dates discussed:    Additional Comments:  Marilu Favre, RN 05/18/2017, 10:14 AM

## 2017-05-19 ENCOUNTER — Telehealth: Payer: Self-pay

## 2017-05-19 NOTE — Telephone Encounter (Signed)
Left message to call [facility] if any questions problems.

## 2019-07-17 ENCOUNTER — Other Ambulatory Visit: Payer: Self-pay

## 2019-07-18 ENCOUNTER — Ambulatory Visit: Payer: Managed Care, Other (non HMO) | Admitting: Obstetrics & Gynecology

## 2019-07-18 ENCOUNTER — Encounter: Payer: Self-pay | Admitting: Obstetrics & Gynecology

## 2019-07-18 VITALS — BP 120/82 | Ht 61.5 in | Wt 191.0 lb

## 2019-07-18 DIAGNOSIS — N951 Menopausal and female climacteric states: Secondary | ICD-10-CM | POA: Diagnosis not present

## 2019-07-18 DIAGNOSIS — F330 Major depressive disorder, recurrent, mild: Secondary | ICD-10-CM

## 2019-07-18 MED ORDER — PROGESTERONE MICRONIZED 100 MG PO CAPS: 100.0000 mg | ORAL_CAPSULE | Freq: Every day | ORAL | 4 refills | Status: DC

## 2019-07-18 MED ORDER — ESTRADIOL 0.05 MG/24HR TD PTWK: 0.0500 mg | MEDICATED_PATCH | TRANSDERMAL | 4 refills | Status: DC

## 2019-07-18 NOTE — Progress Notes (Signed)
° ° °  Amanda Brewer [birth year] [id number]   History:    57 y.o. G2P1A1L1 Accompanied by her husband.  RP:  New patient presenting for annual gyn exam   HPI: Post menopause currently on no HRT, no PMB.  No pelvic pain.  Mild hot flushes/night sweats.  C/O mood issues, Major Depression with no suicidal ideations on Zoloft recently increased to 100 mg daily.  Difficulty loosing weight with BMI at 35.50.  No Bone Density done.  No first-degree relative with breast cancer.  No personal history of blood clots such as DVT/pulmonary embolism/stroke.  Patient is on atorvastatin and Plaquenil.  Past medical history,surgical history, family history and social history were all reviewed and documented in the EPIC chart.  Gynecologic History No LMP recorded. Patient is postmenopausal. Contraception: post menopausal status Last Pap: 2018. Results were: normal per patient Last mammogram: 06/2019. Results were: normal per patient Bone Density: Never Colonoscopy: 2018  Obstetric History OB History  Gravida Para Term Preterm AB Living  2 1     1 1   SAB TAB Ectopic Multiple Live Births  1            # Outcome Date GA Lbr Len/2nd Weight Sex Delivery Anes PTL Lv  2 SAB           1 Para              ROS: A ROS was performed and pertinent positives and negatives are included in the history.  GENERAL: No fevers or chills. HEENT: No change in vision, no earache, sore throat or sinus congestion. NECK: No pain or stiffness. CARDIOVASCULAR: No chest pain or pressure. No palpitations. PULMONARY: No shortness of breath, cough or wheeze. GASTROINTESTINAL: No abdominal pain, nausea, vomiting or diarrhea, melena or bright red blood per rectum. GENITOURINARY: No urinary frequency, urgency, hesitancy or dysuria. MUSCULOSKELETAL: No joint or muscle pain, no back pain, no recent trauma. DERMATOLOGIC: No rash, no itching, no lesions. ENDOCRINE: No polyuria, polydipsia, no heat or cold intolerance. No recent change in weight.  HEMATOLOGICAL: No anemia or easy bruising or bleeding. NEUROLOGIC: No headache, seizures, numbness, tingling or weakness. PSYCHIATRIC: No depression, no loss of interest in normal activity or change in sleep pattern.     Exam:   BP 120/82    Ht 5' 1.5" (1.562 m)    Wt 191 lb (86.6 kg)    BMI 35.50 kg/m   Body mass index is 35.5 kg/m.  General appearance : Well developed well nourished female. No acute distress  Pelvic: Deferred   Assessment/Plan:  58 y.o. female for annual exam   1. Menopausal syndrome Counseling on Menopause done.  Hormonal panel by [facility] reviewed.  HRT options reviewed.  Usage/Risks and Benefits thoroughly discussed.  Decision to start on HRT with Estradiol patch 0.05 weekly and Progesterone 100 mg 1 caps at bedtime daily.  Prescriptions sent to pharmacy.  2. Mild episode of recurrent major depressive disorder ([place]) Will continue on Zoloft.  Other orders - etanercept (ENBREL) 50 MG/ML injection; Inject 50 mg into the skin once a week. - estradiol (CLIMARA - DOSED IN MG/24 HR) 0.05 mg/24hr patch; Place 1 patch (0.05 mg total) onto the skin once a week. - progesterone (PROMETRIUM) 100 MG capsule; Take 1 capsule (100 mg total) by mouth at bedtime.  Counseling on above issues and coordination of care more than 50% for 45 minutes.  [name] MD, 12:35 PM 07/18/2019

## 2019-07-23 ENCOUNTER — Encounter: Payer: Self-pay | Admitting: Obstetrics & Gynecology

## 2019-07-23 NOTE — Patient Instructions (Signed)
  1. Menopausal syndrome Counseling on Menopause done.  Hormonal panel by [facility] reviewed.  HRT options reviewed.  Usage/Risks and Benefits thoroughly discussed.  Decision to start on HRT with Estradiol patch 0.05 weekly and Progesterone 100 mg 1 caps at bedtime daily.  Prescriptions sent to pharmacy.  2. Mild episode of recurrent major depressive disorder ([place]) Will continue on Zoloft.  Other orders - etanercept (ENBREL) 50 MG/ML injection; Inject 50 mg into the skin once a week. - estradiol (CLIMARA - DOSED IN MG/24 HR) 0.05 mg/24hr patch; Place 1 patch (0.05 mg total) onto the skin once a week. - progesterone (PROMETRIUM) 100 MG capsule; Take 1 capsule (100 mg total) by mouth at bedtime.  Amanda Brewer, it was a pleasure meeting you today!

## 2019-10-30 ENCOUNTER — Encounter: Payer: Self-pay | Admitting: Neurology

## 2019-11-26 ENCOUNTER — Other Ambulatory Visit: Payer: Self-pay

## 2019-11-27 ENCOUNTER — Ambulatory Visit (INDEPENDENT_AMBULATORY_CARE_PROVIDER_SITE_OTHER): Payer: Managed Care, Other (non HMO) | Admitting: Obstetrics & Gynecology

## 2019-11-27 ENCOUNTER — Encounter: Payer: Self-pay | Admitting: Obstetrics & Gynecology

## 2019-11-27 VITALS — BP 120/74 | Ht 61.5 in | Wt 192.0 lb

## 2019-11-27 DIAGNOSIS — Z01419 Encounter for gynecological examination (general) (routine) without abnormal findings: Secondary | ICD-10-CM | POA: Diagnosis not present

## 2019-11-27 DIAGNOSIS — M8589 Other specified disorders of bone density and structure, multiple sites: Secondary | ICD-10-CM

## 2019-11-27 DIAGNOSIS — Z6835 Body mass index (BMI) 35.0-35.9, adult: Secondary | ICD-10-CM

## 2019-11-27 DIAGNOSIS — Z79890 Hormone replacement therapy (postmenopausal): Secondary | ICD-10-CM | POA: Diagnosis not present

## 2019-11-27 MED ORDER — PROGESTERONE MICRONIZED 100 MG PO CAPS: 100.0000 mg | ORAL_CAPSULE | Freq: Every day | ORAL | 4 refills | Status: DC

## 2019-11-27 MED ORDER — ESTRADIOL 0.05 MG/24HR TD PTWK: 0.0500 mg | MEDICATED_PATCH | TRANSDERMAL | 4 refills | Status: DC

## 2019-11-27 NOTE — Progress Notes (Signed)
Amanda Brewer Sep 25, 1960 [id number]   History:    59 y.o. G2P1A1L1  RP:  Established patient presenting for annual gyn exam   HPI: Post menopause well on HRT with Climara patch 0.05 and Prometrium 100 mg HS x 07/2019.  No PMB.  No pelvic pain.   Major Depression with no suicidal ideations, stable now on Zoloft 150 mg daily. BMI 35.69.  Urine/BMs normal.  Breasts normal.  Health labs with Fam MD.  Patient is on atorvastatin and Plaquenil.   Past medical history,surgical history, family history and social history were all reviewed and documented in the EPIC chart.  Gynecologic History No LMP recorded. Patient is postmenopausal.  Obstetric History OB History  Gravida Para Term Preterm AB Living  2 1     1 1   SAB TAB Ectopic Multiple Live Births  1            # Outcome Date GA Lbr Len/2nd Weight Sex Delivery Anes PTL Lv  2 SAB           1 Para              ROS: A ROS was performed and pertinent positives and negatives are included in the history.  GENERAL: No fevers or chills. HEENT: No change in vision, no earache, sore throat or sinus congestion. NECK: No pain or stiffness. CARDIOVASCULAR: No chest pain or pressure. No palpitations. PULMONARY: No shortness of breath, cough or wheeze. GASTROINTESTINAL: No abdominal pain, nausea, vomiting or diarrhea, melena or bright red blood per rectum. GENITOURINARY: No urinary frequency, urgency, hesitancy or dysuria. MUSCULOSKELETAL: No joint or muscle pain, no back pain, no recent trauma. DERMATOLOGIC: No rash, no itching, no lesions. ENDOCRINE: No polyuria, polydipsia, no heat or cold intolerance. No recent change in weight. HEMATOLOGICAL: No anemia or easy bruising or bleeding. NEUROLOGIC: No headache, seizures, numbness, tingling or weakness. PSYCHIATRIC: No depression, no loss of interest in normal activity or change in sleep pattern.     Exam:     11/27/19 3:55 PM    BP  120/74   Weight  192lb(87.1kg)   Height  5'1.5"(1.59m)     Other Vitals  BMI 35.69 kg/m2  BSA 1.94 m2       General appearance : Well developed well nourished female. No acute distress HEENT: Eyes: no retinal hemorrhage or exudates,  Neck supple, trachea midline, no carotid bruits, no thyroidmegaly Lungs: Clear to auscultation, no rhonchi or wheezes, or rib retractions  Heart: Regular rate and rhythm, no murmurs or gallops Breast:Examined in sitting and supine position were symmetrical in appearance, no palpable masses or tenderness,  no skin retraction, no nipple inversion, no nipple discharge, no skin discoloration, no axillary or supraclavicular lymphadenopathy Abdomen: no palpable masses or tenderness, no rebound or guarding Extremities: no edema or skin discoloration or tenderness  Pelvic: Vulva: Normal             Vagina: No gross lesions or discharge  Cervix: No gross lesions or discharge.  Pap reflex done.  Uterus  AV, normal size, shape and consistency, non-tender and mobile  Adnexa  Without masses or tenderness  Anus: Normal   Assessment/Plan:  59 y.o. female for annual exam   1. Encounter for routine gynecological examination with Papanicolaou smear of cervix Normal gynecologic exam in menopause.  Pap reflex done today.  Breast exam normal.  Last screening mammogram October 2020 was benign.  Colonoscopy 2018.  Health labs with family physician. - Pap IG w/ reflex  to HPV when ASC-U  2. Postmenopausal hormone replacement therapy Well on hormone replacement therapy with Climara 0.05 patch weekly and Prometrium 100 mg at bedtime daily.  No postmenopausal bleeding.  Vasomotor and mood symptoms well controlled on that dosage.  No contraindication to hormone replacement therapy.  Prescription sent to pharmacy.  3. Osteopenia of multiple sites Bone density October 2020 showed osteopenia.  Patient will continue on hormone replacement therapy.  Vitamin D supplements, calcium intake of 1200 mg daily and regular weightbearing physical  activities.  We will repeat a bone density in 2 years.  4. Class 2 severe obesity due to excess calories with serious comorbidity and body mass index (BMI) of 35.0 to 35.9 in adult [facility]) Recommend a lower calorie/carb diet such as [employer].  Aerobic activities 5 times a week and light weightlifting every 2 days.  Other orders - sertraline (ZOLOFT) 100 MG tablet; Take 150 mg by mouth daily. - estradiol (CLIMARA - DOSED IN MG/24 HR) 0.05 mg/24hr patch; Place 1 patch (0.05 mg total) onto the skin once a week. - progesterone (PROMETRIUM) 100 MG capsule; Take 1 capsule (100 mg total) by mouth at bedtime.  [name] MD, 4:04 PM 11/27/2019

## 2019-11-27 NOTE — Patient Instructions (Signed)
1. Encounter for routine gynecological examination with Papanicolaou smear of cervix Normal gynecologic exam in menopause.  Pap reflex done today.  Breast exam normal.  Last screening mammogram October 2020 was benign.  Colonoscopy 2018.  Health labs with family physician. - Pap IG w/ reflex to HPV when ASC-U  2. Postmenopausal hormone replacement therapy Well on hormone replacement therapy with Climara 0.05 patch weekly and Prometrium 100 mg at bedtime daily.  No postmenopausal bleeding.  Vasomotor and mood symptoms well controlled on that dosage.  No contraindication to hormone replacement therapy.  Prescription sent to pharmacy.  3. Osteopenia of multiple sites Bone density October 2020 showed osteopenia.  Patient will continue on hormone replacement therapy.  Vitamin D supplements, calcium intake of 1200 mg daily and regular weightbearing physical activities.  We will repeat a bone density in 2 years.  4. Class 2 severe obesity due to excess calories with serious comorbidity and body mass index (BMI) of 35.0 to 35.9 in adult [facility]) Recommend a lower calorie/carb diet such as [employer].  Aerobic activities 5 times a week and light weightlifting every 2 days.  Other orders - sertraline (ZOLOFT) 100 MG tablet; Take 150 mg by mouth daily. - estradiol (CLIMARA - DOSED IN MG/24 HR) 0.05 mg/24hr patch; Place 1 patch (0.05 mg total) onto the skin once a week. - progesterone (PROMETRIUM) 100 MG capsule; Take 1 capsule (100 mg total) by mouth at bedtime.  Amanda Brewer, it was a pleasure seeing you today!  I will inform you of your results as soon as they are available.

## 2019-11-28 LAB — PAP IG W/ RFLX HPV ASCU

## 2019-12-27 ENCOUNTER — Telehealth: Payer: Self-pay | Admitting: *Deleted

## 2019-12-27 NOTE — Telephone Encounter (Signed)
Patient called c/o 1 day of spotting only, no flow. Patient takes weekly climara 0.05 mg patch and progesterone 100 mg capsules 1 po at bedtime.  Reports she did have spotting when wiping after annual visit on 11/27/19, but did not last long went away. Aware spotting is abnormal reports no bleeding in several years. Office visit? or ultrasound with office visit afterwards?

## 2019-12-28 ENCOUNTER — Other Ambulatory Visit: Payer: Self-pay

## 2019-12-28 DIAGNOSIS — N950 Postmenopausal bleeding: Secondary | ICD-10-CM

## 2019-12-28 NOTE — Telephone Encounter (Signed)
Patient called in voice mail stating she had not heard back from the nurse. I called her back and told her I had just received Dr. Mariah Milling reply from her call and she recommend u/s w a visit.  I told her I have placed order on file and she just needs to call the apointment desk to schedule an appointment for the u/s.

## 2019-12-28 NOTE — Telephone Encounter (Signed)
Yes Pelvic US with me.

## 2020-01-14 ENCOUNTER — Other Ambulatory Visit: Payer: Self-pay

## 2020-01-15 ENCOUNTER — Encounter: Payer: Self-pay | Admitting: Obstetrics & Gynecology

## 2020-01-15 ENCOUNTER — Ambulatory Visit (INDEPENDENT_AMBULATORY_CARE_PROVIDER_SITE_OTHER): Payer: Managed Care, Other (non HMO)

## 2020-01-15 ENCOUNTER — Ambulatory Visit: Payer: Managed Care, Other (non HMO) | Admitting: Obstetrics & Gynecology

## 2020-01-15 DIAGNOSIS — N950 Postmenopausal bleeding: Secondary | ICD-10-CM

## 2020-01-15 DIAGNOSIS — R9389 Abnormal findings on diagnostic imaging of other specified body structures: Secondary | ICD-10-CM

## 2020-01-15 DIAGNOSIS — N854 Malposition of uterus: Secondary | ICD-10-CM | POA: Diagnosis not present

## 2020-01-15 DIAGNOSIS — N840 Polyp of corpus uteri: Secondary | ICD-10-CM

## 2020-01-15 NOTE — Patient Instructions (Signed)
1. Post-menopausal bleeding Postmenopausal bleeding on hormone replacement therapy.  Pelvic ultrasound reviewed thoroughly with patient today.  The endometrial lining is thickened and asymmetrical with cystic changes, measured at 5.05 mm.  An endometrial mass corresponding to a probable endometrial polyp is present measured at 9 x 6 mm.  Another mass is present in the cervical canal compatible with an endocervical polyp measured at 6 x 3 mm.  We will proceed with a hysteroscopy, excision of polyps with MyoSure and D&C.  Information and pamphlet given on hysteroscopy.  Recommend stopping estrogen hormonal therapy and continuing Prometrium until surgery.  Follow-up preop.  2. Endometrial polyp As above.  Amanda Brewer, it was a pleasure seeing you today!

## 2020-01-15 NOTE — Progress Notes (Signed)
    Amanda Brewer 1960-09-19 [id number]        59 y.o.  G2P0011   RP: PMB on HRT for Pelvic US  HPI: Light PMB on HRT.  At the end of March, early April 2021.  No pelvic pain.  No abnormal discharge.  No fever.   OB History  Gravida Para Term Preterm AB Living  2 1     1 1   SAB TAB Ectopic Multiple Live Births  1            # Outcome Date GA Lbr Len/2nd Weight Sex Delivery Anes PTL Lv  2 SAB           1 Para             Past medical history,surgical history, problem list, medications, allergies, family history and social history were all reviewed and documented in the EPIC chart.   Directed ROS with pertinent positives and negatives documented in the history of present illness/assessment and plan.  Exam:  There were no vitals filed for this visit. General appearance:  Normal  Pelvic US today: T/V images.  Anteverted uterus normal in size and shape with no myometrial mass.  The uterus is measured at 7.48 x 4.78 x 3.94 cm.  The endometrial lining is thickened and asymmetrical with cystic changes, measured at 5.05 mm.  A feeder vessel is identified to a 9 x 6 mm endometrial mass.  3D imaging confirms an intracavitary lesion.  A second mass is visualized within the cervical canal measured at 6 x 3 mm with a feeder vessel identified.  Both ovaries are small with atrophic appearance.  No adnexal mass.  No free fluid in the posterior cul-de-sac.   Assessment/Plan:  59 y.o. G2P0011   1. Post-menopausal bleeding Postmenopausal bleeding on hormone replacement therapy.  Pelvic ultrasound reviewed thoroughly with patient today.  The endometrial lining is thickened and asymmetrical with cystic changes, measured at 5.05 mm.  An endometrial mass corresponding to a probable endometrial polyp is present measured at 9 x 6 mm.  Another mass is present in the cervical canal compatible with an endocervical polyp measured at 6 x 3 mm.  We will proceed with a hysteroscopy, excision of polyps with MyoSure  and D&C.  Information and pamphlet given on hysteroscopy.  Recommend stopping estrogen hormonal therapy and continuing Prometrium until surgery.  Follow-up preop.  2. Endometrial polyp As above.  [name] MD, 12:01 PM 01/15/2020

## 2020-01-16 ENCOUNTER — Telehealth: Payer: Self-pay

## 2020-01-16 NOTE — Telephone Encounter (Signed)
I spoke with patient about scheduling surgery.  We discussed available dates. Surgery scheduled for 02/15/20 at 8:30am at [facility].  Pre op visit was scheduled with Dr. Marguerita Merles.  We discussed her ins benefits and her est surgery prepymt due to [place] by one week prior to surgery.    We discussed need for Covid test drive thru pre operatively and appt was scheduled. I advised her of post Covid test protocol instructions as well.  Packet will be mailed to her.

## 2020-01-18 ENCOUNTER — Encounter: Payer: Self-pay | Admitting: Anesthesiology

## 2020-01-21 ENCOUNTER — Telehealth: Payer: Self-pay | Admitting: *Deleted

## 2020-01-21 MED ORDER — MEGESTROL ACETATE 40 MG PO TABS: 40.0000 mg | ORAL_TABLET | Freq: Two times a day (BID) | ORAL | 0 refills | Status: DC

## 2020-01-21 NOTE — Telephone Encounter (Signed)
Patient informed, Rx sent.  

## 2020-01-21 NOTE — Telephone Encounter (Signed)
Patient called scheduled for D&C on 02/15/20 called today c/o increased bleeding, was having light bleeding at [facility] on 01/15/20. Reports this weekend on Saturday light bleeding, Sunday heavier flow, wearing pads changing every 4-6 hours. She wanted to let you know and asked okay to actively bled? Or Rx to stop? Please advise

## 2020-01-21 NOTE — Telephone Encounter (Signed)
Please start on Megace 40 mg BID until surgery.  #60, no refill.

## 2020-01-28 ENCOUNTER — Other Ambulatory Visit: Payer: Self-pay

## 2020-01-29 ENCOUNTER — Ambulatory Visit: Payer: Managed Care, Other (non HMO) | Admitting: Obstetrics & Gynecology

## 2020-01-29 ENCOUNTER — Encounter: Payer: Self-pay | Admitting: Obstetrics & Gynecology

## 2020-01-29 DIAGNOSIS — R9389 Abnormal findings on diagnostic imaging of other specified body structures: Secondary | ICD-10-CM | POA: Diagnosis not present

## 2020-01-29 DIAGNOSIS — N950 Postmenopausal bleeding: Secondary | ICD-10-CM

## 2020-01-29 DIAGNOSIS — N840 Polyp of corpus uteri: Secondary | ICD-10-CM

## 2020-01-29 NOTE — Patient Instructions (Signed)
1. Post-menopausal bleeding Postmenopausal bleeding on hormone replacement therapy.  Pelvic ultrasound on Jan 15, 2020 showed a small endometrial polyp and an endocervical polyp.  Patient stopped her estradiol patch and is still taking Prometrium.  We will proceed with hysteroscopy, MyoSure excision of intra uterine lesions and D&C.  Preop preparation, procedure with risks and postop precautions and expectations thoroughly reviewed with patient.  Patient voiced understanding and agreement with plan.  2. Endometrial polyp As above.                        Patient was counseled as to the risk of surgery to include the following:  1. Infection (prohylactic antibiotics will be administered)  2. DVT/Pulmonary Embolism (prophylactic pneumo compression stockings will be used)  3.Trauma to internal organs requiring additional surgical procedure to repair any injury to internal organs requiring perhaps additional hospitalization days.  4.Hemmorhage requiring transfusion and blood products which carry risks such as anaphylactic reaction, hepatitis and AIDS  Patient had received literature information on the procedure scheduled and all her questions were answered and fully accepts all risk.   Veronique, it was a pleasure seeing you today!

## 2020-01-29 NOTE — Progress Notes (Signed)
    Amanda Brewer Nov 02, 1960 [id number]        59 y.o.  G2P0011 Husband present  RP: Preop HSC/Myosure excision/D+C  HPI: PMB on HRT with probable polyps on Pelvic US 01/15/2020.  Stopped Estradiol patch, still taking the Prometrium.  No current vaginal bleeding.  No pelvic pain.   OB History  Gravida Para Term Preterm AB Living  2 1     1 1   SAB TAB Ectopic Multiple Live Births  1            # Outcome Date GA Lbr Len/2nd Weight Sex Delivery Anes PTL Lv  2 SAB           1 Para             Past medical history,surgical history, problem list, medications, allergies, family history and social history were all reviewed and documented in the EPIC chart.   Directed ROS with pertinent positives and negatives documented in the history of present illness/assessment and plan.  Exam:  Vitals:   General appearance:  Normal  Gynecologic exam deferred   Assessment/Plan:  59 y.o. G2P0011   1. Post-menopausal bleeding Postmenopausal bleeding on hormone replacement therapy.  Pelvic ultrasound on Jan 15, 2020 showed a small endometrial polyp and an endocervical polyp.  Patient stopped her estradiol patch and is still taking Prometrium.  We will proceed with hysteroscopy, MyoSure excision of intra uterine lesions and D&C.  Preop preparation, procedure with risks and postop precautions and expectations thoroughly reviewed with patient.  Patient voiced understanding and agreement with plan.  2. Endometrial polyp As above.                        Patient was counseled as to the risk of surgery to include the following:  1. Infection (prohylactic antibiotics will be administered)  2. DVT/Pulmonary Embolism (prophylactic pneumo compression stockings will be used)  3.Trauma to internal organs requiring additional surgical procedure to repair any injury to internal organs requiring perhaps additional hospitalization days.  4.Hemmorhage requiring transfusion and blood products which carry risks  such as anaphylactic reaction, hepatitis and AIDS  Patient had received literature information on the procedure scheduled and all her questions were answered and fully accepts all risk.   [name] MD, 11:24 AM 01/29/2020

## 2020-02-01 ENCOUNTER — Encounter: Payer: Self-pay | Admitting: Neurology

## 2020-02-01 ENCOUNTER — Other Ambulatory Visit: Payer: Self-pay

## 2020-02-01 ENCOUNTER — Ambulatory Visit: Payer: Managed Care, Other (non HMO) | Admitting: Neurology

## 2020-02-01 VITALS — BP 145/83 | HR 82 | Ht 61.5 in | Wt 195.0 lb

## 2020-02-01 DIAGNOSIS — R41841 Cognitive communication deficit: Secondary | ICD-10-CM | POA: Diagnosis not present

## 2020-02-01 DIAGNOSIS — F809 Developmental disorder of speech and language, unspecified: Secondary | ICD-10-CM

## 2020-02-01 NOTE — Progress Notes (Signed)
NEUROLOGY CONSULTATION NOTE  Amanda Brewer MRN: [id number] DOB: 07-28-1961  Referring provider: Berline Chough, PA Primary care provider: Berline Chough, PA  Reason for consult:  Memory changes  Thank you for your kind referral of Amanda Brewer for consultation of the above symptoms. Although her history is well known to you, please allow me to reiterate it for the purpose of our medical record. The patient was accompanied to the clinic by husband who also provides collateral information. Records and images were personally reviewed where available.   HISTORY OF PRESENT ILLNESS: This is a 59 year old right-handed woman with a history of hyperlipidemia, rheumatoid arthritis, depression, OSA on CPAP, presenting for evaluation of memory changes. Her husband is present to provide additional information. Her husband reports that memory changes started around 2014, progressively worsening in the past year with short-term memory loss and communication issues. She feels her memory is getting worse, it takes so much concentration to carry on a conversation. She cannot get distracted and has to close her eyes to think. 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 She has a list of things to do. 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 She has a 40-minute commute and denies getting lost driving. She manages bills, a lot are on direct pay. She may forget medications if out of routine, she uses alarms. She misplaces things, catching herself in the middle of putting things out of place, especially when distracted. She wrote down her concerns, saying she has always been "quiet" but did not have trouble talking socially, especially with friends. It has always been difficult for her to talk about feelings and communicate with close relationships. Working with numbers and processes make sense to her and come  naturally. Now it takes much work to concentrate on listening, hearing, and responding, she shies away from communication, even at work. When she is on the spot, her mind goes blank, at work or at home, occurring more often now. If she does not write something down, she forgets. When she does write something, she can only remember the beginning or end of the sentence, her husband has to talk slow so she can get it all. She would then forget they had talked about it, even if she wrote it down. She wonders about selective hearing or memory loss, when she is busy she does not "see" a lot. When off work on weekends, she finally remembers things like meat needing to be put in the freezer and things that need to be done. She recalls she needs to shop but not the items or where her list is. When asked general questions such as how her day was, she is hard pressed to remember. If asked specific questions, she can focus and answer. She has a hard time thinking of a word to describe how she is feeling when asked, but if given a choicce, she can pick out one that would describe how she is feeling.  Her husband agrees with this, he reports her verbal communication is very labored. She struggles to find words, sometimes using the wrong words or the opposite of what she means. Her husband is a Social worker and is constantly working at what she really means to say. He would ask her repeatedly if she meant what she was saying, her favorite phrases are "I don't remember, I don't know, I  forgot." He reports it is hard to function together in a household because of this, he has to think for both of them, and it is impossible to trust what she says, guessing what she means all the time. He reports a lot of responsibility for things at home are on him and he feels very alone.   She has occasional dizziness when standing. She has occasional swallowing difficulties and attributes this to a thyroid nodule, she has to lay down a certain  way. She has some neck achiness, numbness and tingling in her hands. She denies any headaches, diplopia, dysarthria, back pain, bowel/bladder dysfunction, anosmia, or tremors. She has a CPAP but still feels tired. She has been fatigued and sleeping a lot lately. She got very depressed 3-4 months ago and was sleeping more, going to bed to cry/sleep. She had vaginal bleeding with plans for cervical polyp surgery and was taken off her hormone patch. This has all helped with depression/mood swings but she is still sleeping more than normal. Her husband reports the sleep issue is really notable. She has an upcoming appointment for CPAP adjustment.  She is taking Zoloft and hormone supplements and feels her mood is better than it was. She endorses with everything going on, there is more stress, she sees a therapist. They took a week of vacation a few months ago and she was a lot better with communicating, still had problems but there was a noticeable difference without work stress. Her husband denies any paranoia or hallucinations. She feels wobbly when walking, she fell a year ago and broke her teeth, saying she tripped on a ramp. Her husband reports that this is the first time he has heard her say this as a reason, she has always said "I don't know" when asked what happened with the fall. She reports feeling wobbly 3 days ago, lasting for a whole day. Her father and paternal uncle had dementia. Her paternal grandmother had a mental illness that was fairly severe with no clear diagnosis. No history of significant head injuries, she does not drink alcohol.   She has seen neurologists in the past for the same concerns. Records were reviewed. She was evaluated by Dr. Berdine Addison in 2016 and had extensive normal bloodwork. 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  She had a normal EEG.  Neuropsychological evaluation by Dr. Rica Mote in 2016 was normal, there was no compelling evidence of cognitive or emotional problems. It was noted that it may be her difficulty communicating with her husband/relationship issues than any medical problem. She was evaluated by neurologist Dr. Blenda Nicely for a second opinion in 2017 and had repeat Neuropsychological testing in 2018, and it was noted that comparison with prior testing was difficult due to different measures being used, however the current scores did not indicate a clinically significant decline.    PAST MEDICAL HISTORY: Past Medical History:  Diagnosis Date  . Arthritis   . Cancer [facility])    kidney- tumor removed - left  . Gallstones   . Kidney stone   . Seasonal allergies     PAST SURGICAL HISTORY: Past Surgical History:  Procedure Laterality Date  . DILATION AND CURETTAGE OF UTERUS    . KIDNEY SURGERY Left 2018  . LAPAROSCOPIC APPENDECTOMY N/A 05/13/2017   Procedure: APPENDECTOMY LAPAROSCOPIC;  Surgeon: Rolm Bookbinder, MD;  Location: [place];  Service: General;  Laterality: N/A;  .  TUBAL LIGATION      MEDICATIONS: Current Outpatient Medications on File Prior to Visit  Medication Sig Dispense Refill  . aspirin 81 MG chewable tablet Chew by mouth daily.    Marland Kitchen atorvastatin (LIPITOR) 10 MG tablet Take 10 mg by mouth daily.    . Calcium Carb-Cholecalciferol (CALCIUM 600 + D) 600-200 MG-UNIT TABS Take by mouth.    . etanercept (ENBREL) 50 MG/ML injection Inject 50 mg into the skin once a week.    . hydroxychloroquine (PLAQUENIL) 200 MG tablet Take 200 mg by mouth 2 (two) times daily.     . montelukast (SINGULAIR) 10 MG tablet Take 10 mg by mouth at bedtime.    . progesterone (PROMETRIUM) 100 MG capsule Take 1 capsule (100 mg total) by mouth at bedtime. 90 capsule 4  . sertraline (ZOLOFT) 100 MG tablet Take 150 mg by mouth daily.    Marland Kitchen estradiol (CLIMARA - DOSED IN MG/24 HR) 0.05 mg/24hr patch Place 1 patch (0.05 mg total) onto  the skin once a week. (Patient not taking: Reported on 02/01/2020) 12 patch 4  . megestrol (MEGACE) 40 MG tablet Take 1 tablet (40 mg total) by mouth 2 (two) times daily. (Patient not taking: Reported on 02/01/2020) 60 tablet 0   No current facility-administered medications on file prior to visit.    ALLERGIES: No Known Allergies  FAMILY HISTORY: Family History  Problem Relation Age of Onset  . Hypertension Mother   . Osteoarthritis Mother   . Heart attack Father   . Stroke Father   . Alzheimer's disease Father   . Hypertension Father   . Heart attack Brother     SOCIAL HISTORY: Social History   Socioeconomic History  . Marital status: Married    Spouse name: Not on file  . Number of children: Not on file  . Years of education: Not on file  . Highest education level: Not on file  Occupational History  . Not on file  Tobacco Use  . Smoking status: Never Smoker  . Smokeless tobacco: Never Used  Substance and Sexual Activity  . Alcohol use: No  . Drug use: No  . Sexual activity: Yes    Partners: Male    Comment: 1st intercorse- 19, partners- 6, married- 69 yrs,   Other Topics Concern  . Not on file  Social History Narrative   Right handed   Lives with husband    Social Determinants of Health   Financial Resource Strain:   . Difficulty of Paying Living Expenses:   Food Insecurity:   . Worried About Charity fundraiser in the Last Year:   . Arboriculturist in the Last Year:   Transportation Needs:   . Film/video editor (Medical):   Marland Kitchen Lack of Transportation (Non-Medical):   Physical Activity:   . Days of Exercise per Week:   . Minutes of Exercise per Session:   Stress:   . Feeling of Stress :   Social Connections:   . Frequency of Communication with Friends and Family:   . Frequency of Social Gatherings with Friends and Family:   . Attends Religious Services:   . Active Member of Clubs or Organizations:   . Attends Archivist Meetings:   Marland Kitchen  Marital Status:   Intimate Partner Violence:   . Fear of Current or Ex-Partner:   . Emotionally Abused:   Marland Kitchen Physically Abused:   . Sexually Abused:     PHYSICAL EXAM: Vitals:  02/01/20 0904  BP: (!) 145/83  Pulse: 82  SpO2: 98%   General: No acute distress Head:  Normocephalic/atraumatic Skin/Extremities: No rash, no edema Neurological Exam: Mental status: alert and oriented to person, place, and time, no dysarthria or aphasia, Fund of knowledge is appropriate.  Recent and remote memory are impaired.  Attention and concentration are normal.  SLUMS score 23/30. [place] Mental Exam 02/01/2020  Weekday Correct 1  Current year 1  What state are we in? 1  Amount spent 1  Amount left 2  # of Animals 2  5 objects recall 3  Number series 0  Hour markers 2  Time correct 0  Placed X in triangle correctly 1  Largest Figure 1  Name of female 2  Date back to work 2  Type of work 2  State she lived in 2  Total score 23    Cranial nerves: CN I: not tested CN II: pupils equal, round and reactive to light, visual fields intact CN III, IV, VI:  full range of motion, no nystagmus, no ptosis CN V: facial sensation intact CN VII: upper and lower face symmetric CN VIII: hearing intact to conversation Bulk & Tone: normal, no fasciculations. Motor: 5/5 throughout with no pronator drift. Sensation: intact to light touch, cold, pin, vibration and joint position sense.  No extinction to double simultaneous stimulation.  Romberg test negative Deep Tendon Reflexes: +2 throughout, no ankle clonus Plantar responses: downgoing bilaterally Cerebellar: no incoordination on finger to nose testing Gait: narrow-based and steady, able to tandem walk adequately. Tremor: none  IMPRESSION: This is a pleasant 59 year old right-handed woman with a history of hyperlipidemia, rheumatoid arthritis, depression, OSA on CPAP, presenting for evaluation of memory changes. She has had communication  difficulties since around 2014 that have progressively worsened over the years. She has had an MRI brain in 2016 which was unremarkable, Neuropsychological evaluations in 2016 and 2018 did not show any evidence of a neurodegenerative condition. Her neurological exam is normal, SLUMS score today 23/30. I did not appreciate any significant aphasia during today's visit, however with worsening symptoms noted by husband and patient now occurring at work as well, further evaluation will be done with repeat Neurocognitive testing and a PET scan to assess for a frontotemporal process such as Primary Progressive Aphasia. If TSH and B12 not done at PCP office, we will order this as well. We discussed the importance of control of vascular risk factors, physical exercise, and brain stimulation exercises for overall brain health. Follow-up after tests, they know to call for any changes.   Thank you for allowing me to participate in the care of this patient. Please do not hesitate to call for any questions or concerns.   Ellouise Newer, M.D.  CC: Berline Chough, PA

## 2020-02-01 NOTE — Patient Instructions (Signed)
1. Schedule PET scan  2. Schedule Neurocognitive testing  3. bloodwork from PCP will be requested for review, if not done, TSH and B12 will be ordered  4. Follow-up after tests, call for any changes  RECOMMENDATIONS FOR ALL PATIENTS WITH MEMORY PROBLEMS: 1. Continue to exercise (Recommend 30 minutes of walking everyday, or 3 hours every week) 2. Increase social interactions - continue going to [place] and enjoy social gatherings with friends and family 3. Eat healthy, avoid fried foods and eat more fruits and vegetables 4. Maintain adequate blood pressure, blood sugar, and blood cholesterol level. Reducing the risk of stroke and cardiovascular disease also helps promoting better memory. 5. Avoid stressful situations. Live a simple life and avoid aggravations. Organize your time and prepare for the next day in anticipation. 6. Sleep well, avoid any interruptions of sleep and avoid any distractions in the bedroom that may interfere with adequate sleep quality 7. Avoid sugar, avoid sweets as there is a strong link between excessive sugar intake, diabetes, and cognitive impairment The Mediterranean diet has been shown to help patients reduce the risk of progressive memory disorders and reduces cardiovascular risk. This includes eating fish, eat fruits and green leafy vegetables, nuts like almonds and hazelnuts, walnuts, and also use olive oil. Avoid fast foods and fried foods as much as possible. Avoid sweets and sugar as sugar use has been linked to worsening of memory function.

## 2020-02-05 ENCOUNTER — Other Ambulatory Visit: Payer: Self-pay

## 2020-02-05 ENCOUNTER — Telehealth: Payer: Self-pay

## 2020-02-05 DIAGNOSIS — F809 Developmental disorder of speech and language, unspecified: Secondary | ICD-10-CM

## 2020-02-05 DIAGNOSIS — R41841 Cognitive communication deficit: Secondary | ICD-10-CM

## 2020-02-05 NOTE — Telephone Encounter (Signed)
Pt husband called no answer voice mail left informing them that lab work was needed, to call the office for any questions orders placed in epic,

## 2020-02-06 ENCOUNTER — Other Ambulatory Visit (INDEPENDENT_AMBULATORY_CARE_PROVIDER_SITE_OTHER): Payer: Managed Care, Other (non HMO)

## 2020-02-06 ENCOUNTER — Other Ambulatory Visit: Payer: Self-pay

## 2020-02-06 ENCOUNTER — Telehealth: Payer: Self-pay

## 2020-02-06 DIAGNOSIS — F809 Developmental disorder of speech and language, unspecified: Secondary | ICD-10-CM | POA: Diagnosis not present

## 2020-02-06 DIAGNOSIS — R41841 Cognitive communication deficit: Secondary | ICD-10-CM

## 2020-02-06 LAB — VITAMIN B12: Vitamin B-12: 733 pg/mL (ref 211–911)

## 2020-02-06 LAB — TSH: TSH: 1.41 u[IU]/mL (ref 0.35–4.50)

## 2020-02-06 NOTE — Telephone Encounter (Signed)
Pt called and informed that thyroid and B12 normal

## 2020-02-06 NOTE — Telephone Encounter (Signed)
-----   Message from Cameron Sprang, MD sent at 02/06/2020  1:12 PM EDT ----- Pls let her know thyroid and B12 normal, thanks

## 2020-02-08 ENCOUNTER — Encounter (HOSPITAL_BASED_OUTPATIENT_CLINIC_OR_DEPARTMENT_OTHER): Payer: Self-pay | Admitting: Obstetrics & Gynecology

## 2020-02-08 ENCOUNTER — Telehealth: Payer: Self-pay | Admitting: *Deleted

## 2020-02-08 ENCOUNTER — Other Ambulatory Visit: Payer: Self-pay

## 2020-02-08 NOTE — Progress Notes (Signed)
PFIZER COVID VACCINE COMPLETED 12/24/2019

## 2020-02-08 NOTE — Telephone Encounter (Signed)
Stop Aspirin now.  Not concerned though given that low bleeding risk of this procedure.

## 2020-02-08 NOTE — Telephone Encounter (Signed)
Patient informed. 

## 2020-02-08 NOTE — Progress Notes (Signed)
Spoke with: Reginna NPO:  After Midnight, no gum, candy, or mints   Arrival time: 6301SW Lab needs dos----  CBC            Lab results------N/A COVID test ------02/12/2020 Medications to take morning of surgery: None Pre op orders in epic Yes Diabetic medication -----N/A Patient Special Instructions -----Bring CPAP machine day of surgery Pre-Op special Istructions -----N/A Ride home: Shanon Brow (husband [phone]  Patient verbalized understanding of instructions that were given at this phone interview. Patient denies shortness of breath, chest pain, fever, cough a this phone interview.

## 2020-02-08 NOTE — Telephone Encounter (Signed)
Patient scheduled for [facility] on 02/15/20 states she spoke with the pre-admitting nurse to go over medications and was told to check with you about stopping her aspirin prior to surgery? Please advise

## 2020-02-12 ENCOUNTER — Other Ambulatory Visit: Payer: Self-pay | Admitting: Obstetrics & Gynecology

## 2020-02-12 ENCOUNTER — Other Ambulatory Visit (HOSPITAL_COMMUNITY)
Admission: RE | Admit: 2020-02-12 | Discharge: 2020-02-12 | Disposition: A | Discharge status: Home / Self Care | Payer: Managed Care, Other (non HMO) | Source: Ambulatory Visit | Attending: Obstetrics & Gynecology | Admitting: Obstetrics & Gynecology

## 2020-02-12 DIAGNOSIS — Z01812 Encounter for preprocedural laboratory examination: Secondary | ICD-10-CM | POA: Diagnosis not present

## 2020-02-12 DIAGNOSIS — Z20822 Contact with and (suspected) exposure to covid-19: Secondary | ICD-10-CM | POA: Diagnosis not present

## 2020-02-12 LAB — SARS CORONAVIRUS 2 (TAT 6-24 HRS): SARS Coronavirus 2: NEGATIVE

## 2020-02-15 ENCOUNTER — Ambulatory Visit (HOSPITAL_BASED_OUTPATIENT_CLINIC_OR_DEPARTMENT_OTHER): Payer: Managed Care, Other (non HMO) | Admitting: Certified Registered"

## 2020-02-15 ENCOUNTER — Other Ambulatory Visit: Payer: Self-pay

## 2020-02-15 ENCOUNTER — Encounter (HOSPITAL_BASED_OUTPATIENT_CLINIC_OR_DEPARTMENT_OTHER): Payer: Self-pay | Admitting: Obstetrics & Gynecology

## 2020-02-15 ENCOUNTER — Ambulatory Visit (HOSPITAL_BASED_OUTPATIENT_CLINIC_OR_DEPARTMENT_OTHER)
Admission: RE | Admit: 2020-02-15 | Discharge: 2020-02-15 | Disposition: A | Discharge status: Home / Self Care | Payer: Managed Care, Other (non HMO) | Attending: Obstetrics & Gynecology | Admitting: Obstetrics & Gynecology

## 2020-02-15 ENCOUNTER — Encounter (HOSPITAL_BASED_OUTPATIENT_CLINIC_OR_DEPARTMENT_OTHER)
Admission: RE | Disposition: A | Discharge status: Home / Self Care | Payer: Self-pay | Source: Home / Self Care | Attending: Obstetrics & Gynecology

## 2020-02-15 DIAGNOSIS — Z79899 Other long term (current) drug therapy: Secondary | ICD-10-CM | POA: Diagnosis not present

## 2020-02-15 DIAGNOSIS — K219 Gastro-esophageal reflux disease without esophagitis: Secondary | ICD-10-CM | POA: Insufficient documentation

## 2020-02-15 DIAGNOSIS — R9389 Abnormal findings on diagnostic imaging of other specified body structures: Secondary | ICD-10-CM | POA: Insufficient documentation

## 2020-02-15 DIAGNOSIS — Z8261 Family history of arthritis: Secondary | ICD-10-CM | POA: Diagnosis not present

## 2020-02-15 DIAGNOSIS — Z87442 Personal history of urinary calculi: Secondary | ICD-10-CM | POA: Insufficient documentation

## 2020-02-15 DIAGNOSIS — Z7982 Long term (current) use of aspirin: Secondary | ICD-10-CM | POA: Diagnosis not present

## 2020-02-15 DIAGNOSIS — Z8249 Family history of ischemic heart disease and other diseases of the circulatory system: Secondary | ICD-10-CM | POA: Diagnosis not present

## 2020-02-15 DIAGNOSIS — M069 Rheumatoid arthritis, unspecified: Secondary | ICD-10-CM | POA: Diagnosis not present

## 2020-02-15 DIAGNOSIS — E785 Hyperlipidemia, unspecified: Secondary | ICD-10-CM | POA: Insufficient documentation

## 2020-02-15 DIAGNOSIS — N840 Polyp of corpus uteri: Secondary | ICD-10-CM

## 2020-02-15 DIAGNOSIS — Z85528 Personal history of other malignant neoplasm of kidney: Secondary | ICD-10-CM | POA: Diagnosis not present

## 2020-02-15 DIAGNOSIS — Z6835 Body mass index (BMI) 35.0-35.9, adult: Secondary | ICD-10-CM | POA: Diagnosis not present

## 2020-02-15 DIAGNOSIS — E669 Obesity, unspecified: Secondary | ICD-10-CM | POA: Diagnosis not present

## 2020-02-15 DIAGNOSIS — Z820 Family history of epilepsy and other diseases of the nervous system: Secondary | ICD-10-CM | POA: Diagnosis not present

## 2020-02-15 DIAGNOSIS — N950 Postmenopausal bleeding: Secondary | ICD-10-CM | POA: Diagnosis not present

## 2020-02-15 DIAGNOSIS — Z79890 Hormone replacement therapy (postmenopausal): Secondary | ICD-10-CM | POA: Diagnosis not present

## 2020-02-15 DIAGNOSIS — G4733 Obstructive sleep apnea (adult) (pediatric): Secondary | ICD-10-CM | POA: Diagnosis not present

## 2020-02-15 DIAGNOSIS — N841 Polyp of cervix uteri: Secondary | ICD-10-CM | POA: Diagnosis not present

## 2020-02-15 DIAGNOSIS — Z91040 Latex allergy status: Secondary | ICD-10-CM | POA: Diagnosis not present

## 2020-02-15 DIAGNOSIS — I10 Essential (primary) hypertension: Secondary | ICD-10-CM | POA: Diagnosis not present

## 2020-02-15 DIAGNOSIS — Z823 Family history of stroke: Secondary | ICD-10-CM | POA: Diagnosis not present

## 2020-02-15 HISTORY — DX: Nontoxic multinodular goiter: E04.2

## 2020-02-15 HISTORY — DX: Personal history of diseases of the blood and blood-forming organs and certain disorders involving the immune mechanism: Z86.2

## 2020-02-15 HISTORY — DX: Personal history of urinary calculi: Z87.442

## 2020-02-15 HISTORY — DX: Unspecified hearing loss, right ear: H91.91

## 2020-02-15 HISTORY — DX: Malignant neoplasm of left kidney, except renal pelvis: C64.2

## 2020-02-15 HISTORY — DX: Presence of external hearing-aid: Z97.4

## 2020-02-15 HISTORY — DX: Polyp of corpus uteri: N84.0

## 2020-02-15 HISTORY — DX: Carpal tunnel syndrome, right upper limb: G56.01

## 2020-02-15 HISTORY — DX: Obstructive sleep apnea (adult) (pediatric): G47.33

## 2020-02-15 HISTORY — DX: Rheumatoid arthritis, unspecified: M06.9

## 2020-02-15 HISTORY — DX: Hyperlipidemia, unspecified: E78.5

## 2020-02-15 HISTORY — PX: DILATATION & CURETTAGE/HYSTEROSCOPY WITH MYOSURE: SHX6511

## 2020-02-15 HISTORY — DX: Acute appendicitis with perforation, localized peritonitis, and gangrene, without abscess: K35.32

## 2020-02-15 HISTORY — DX: Presence of spectacles and contact lenses: Z97.3

## 2020-02-15 HISTORY — DX: Cardiac arrhythmia, unspecified: I49.9

## 2020-02-15 HISTORY — DX: Other specified disorders of kidney and ureter: N28.89

## 2020-02-15 HISTORY — DX: Obesity, unspecified: E66.9

## 2020-02-15 HISTORY — DX: Essential (primary) hypertension: I10

## 2020-02-15 HISTORY — DX: Postmenopausal bleeding: N95.0

## 2020-02-15 HISTORY — DX: Gastro-esophageal reflux disease without esophagitis: K21.9

## 2020-02-15 LAB — CBC
HCT: 44 % (ref 36.0–46.0)
Hemoglobin: 14.9 g/dL (ref 12.0–15.0)
MCH: 31 pg (ref 26.0–34.0)
MCHC: 33.9 g/dL (ref 30.0–36.0)
MCV: 91.5 fL (ref 80.0–100.0)
Platelets: 151 10*3/uL (ref 150–400)
RBC: 4.81 MIL/uL (ref 3.87–5.11)
RDW: 12.2 % (ref 11.5–15.5)
WBC: 6.5 10*3/uL (ref 4.0–10.5)
nRBC: 0 % (ref 0.0–0.2)

## 2020-02-15 SURGERY — DILATATION & CURETTAGE/HYSTEROSCOPY WITH MYOSURE
Anesthesia: General

## 2020-02-15 MED ORDER — MIDAZOLAM HCL 2 MG/2ML IJ SOLN
INTRAMUSCULAR | Status: AC
  Filled 2020-02-15: qty 2

## 2020-02-15 MED ORDER — CEFAZOLIN SODIUM-DEXTROSE 2-4 GM/100ML-% IV SOLN
INTRAVENOUS | Status: AC
  Filled 2020-02-15: qty 100

## 2020-02-15 MED ORDER — SODIUM CHLORIDE 0.9 % IR SOLN
Status: DC | PRN
  Administered 2020-02-15: 3000 mL via INTRAVESICAL

## 2020-02-15 MED ORDER — CEFAZOLIN SODIUM-DEXTROSE 2-4 GM/100ML-% IV SOLN
2.0000 g | INTRAVENOUS | Status: AC
  Administered 2020-02-15: 2 g via INTRAVENOUS

## 2020-02-15 MED ORDER — OXYCODONE HCL 5 MG/5ML PO SOLN: 5.0000 mg | Freq: Once | ORAL | Status: DC | PRN

## 2020-02-15 MED ORDER — KETOROLAC TROMETHAMINE 30 MG/ML IJ SOLN
30.0000 mg | Freq: Once | INTRAMUSCULAR | Status: AC | PRN
  Administered 2020-02-15: 30 mg via INTRAVENOUS

## 2020-02-15 MED ORDER — ONDANSETRON HCL 4 MG/2ML IJ SOLN
INTRAMUSCULAR | Status: DC | PRN
  Administered 2020-02-15: 4 mg via INTRAVENOUS

## 2020-02-15 MED ORDER — ACETAMINOPHEN 325 MG PO TABS: 325.0000 mg | ORAL_TABLET | ORAL | Status: DC | PRN

## 2020-02-15 MED ORDER — PROPOFOL 10 MG/ML IV BOLUS
INTRAVENOUS | Status: DC | PRN
  Administered 2020-02-15: 150 mg via INTRAVENOUS

## 2020-02-15 MED ORDER — LACTATED RINGERS IV SOLN
INTRAVENOUS | Status: DC
  Administered 2020-02-15: 125 mL via INTRAVENOUS

## 2020-02-15 MED ORDER — LIDOCAINE HCL 1 % IJ SOLN
INTRAMUSCULAR | Status: DC | PRN
  Administered 2020-02-15: 20 mL

## 2020-02-15 MED ORDER — MEPERIDINE HCL 25 MG/ML IJ SOLN: 6.2500 mg | INTRAMUSCULAR | Status: DC | PRN

## 2020-02-15 MED ORDER — LIDOCAINE 2% (20 MG/ML) 5 ML SYRINGE
INTRAMUSCULAR | Status: DC | PRN
  Administered 2020-02-15: 100 mg via INTRAVENOUS

## 2020-02-15 MED ORDER — FENTANYL CITRATE (PF) 250 MCG/5ML IJ SOLN
INTRAMUSCULAR | Status: DC | PRN
  Administered 2020-02-15: 50 ug via INTRAVENOUS
  Administered 2020-02-15 (×2): 25 ug via INTRAVENOUS

## 2020-02-15 MED ORDER — DEXAMETHASONE SODIUM PHOSPHATE 10 MG/ML IJ SOLN
INTRAMUSCULAR | Status: AC
  Filled 2020-02-15: qty 1

## 2020-02-15 MED ORDER — SUCCINYLCHOLINE CHLORIDE 200 MG/10ML IV SOSY
PREFILLED_SYRINGE | INTRAVENOUS | Status: AC
  Filled 2020-02-15: qty 10

## 2020-02-15 MED ORDER — MIDAZOLAM HCL 5 MG/5ML IJ SOLN
INTRAMUSCULAR | Status: DC | PRN
  Administered 2020-02-15: 2 mg via INTRAVENOUS

## 2020-02-15 MED ORDER — DEXAMETHASONE SODIUM PHOSPHATE 10 MG/ML IJ SOLN
INTRAMUSCULAR | Status: DC | PRN
Start: 2020-02-15 — End: 2020-02-15
  Administered 2020-02-15: 4 mg via INTRAVENOUS

## 2020-02-15 MED ORDER — OXYCODONE HCL 5 MG PO TABS: 5.0000 mg | ORAL_TABLET | Freq: Once | ORAL | Status: DC | PRN

## 2020-02-15 MED ORDER — ACETAMINOPHEN 160 MG/5ML PO SOLN: 325.0000 mg | ORAL | Status: DC | PRN

## 2020-02-15 MED ORDER — LIDOCAINE 2% (20 MG/ML) 5 ML SYRINGE
INTRAMUSCULAR | Status: AC
  Filled 2020-02-15: qty 5

## 2020-02-15 MED ORDER — KETOROLAC TROMETHAMINE 30 MG/ML IJ SOLN
INTRAMUSCULAR | Status: AC
  Filled 2020-02-15: qty 1

## 2020-02-15 MED ORDER — FENTANYL CITRATE (PF) 100 MCG/2ML IJ SOLN
INTRAMUSCULAR | Status: AC
  Filled 2020-02-15: qty 2

## 2020-02-15 MED ORDER — FENTANYL CITRATE (PF) 100 MCG/2ML IJ SOLN: 25.0000 ug | INTRAMUSCULAR | Status: DC | PRN

## 2020-02-15 MED ORDER — ONDANSETRON HCL 4 MG/2ML IJ SOLN
INTRAMUSCULAR | Status: AC
  Filled 2020-02-15: qty 2

## 2020-02-15 MED ORDER — CEFAZOLIN SODIUM 1 G IJ SOLR
INTRAMUSCULAR | Status: AC
  Filled 2020-02-15: qty 20

## 2020-02-15 MED ORDER — ONDANSETRON HCL 4 MG/2ML IJ SOLN: 4.0000 mg | Freq: Once | INTRAMUSCULAR | Status: DC | PRN

## 2020-02-15 SURGICAL SUPPLY — 14 items
CATH SILICONE 16FRX5CC (CATHETERS) ×2 IMPLANT
COVER WAND RF STERILE (DRAPES) ×2 IMPLANT
DEVICE MYOSURE LITE (MISCELLANEOUS) ×2 IMPLANT
GAUZE 4X4 16PLY RFD (DISPOSABLE) ×2 IMPLANT
GLOVE BIOGEL PI IND STRL 7.0 (GLOVE) ×3 IMPLANT
GLOVE BIOGEL PI INDICATOR 7.0 (GLOVE) ×3
GLOVE SURG SS PI 6.5 STRL IVOR (GLOVE) ×2 IMPLANT
GOWN STRL REUS W/TWL LRG LVL3 (GOWN DISPOSABLE) ×4 IMPLANT
IV NS IRRIG 3000ML ARTHROMATIC (IV SOLUTION) ×2 IMPLANT
KIT PROCEDURE FLUENT (KITS) ×2 IMPLANT
PACK VAGINAL MINOR WOMEN LF (CUSTOM PROCEDURE TRAY) ×2 IMPLANT
PAD OB MATERNITY 4.3X12.25 (PERSONAL CARE ITEMS) ×2 IMPLANT
PAD PREP 24X48 CUFFED NSTRL (MISCELLANEOUS) ×2 IMPLANT
SEAL ROD LENS SCOPE MYOSURE (ABLATOR) ×2 IMPLANT

## 2020-02-15 NOTE — Transfer of Care (Signed)
Immediate Anesthesia Transfer of Care Note  Patient: Amanda Brewer  Procedure(s) Performed: DILATATION & CURETTAGE/HYSTEROSCOPY WITH MYOSURE (N/A )  Patient Location: PACU  Anesthesia Type:General  Level of Consciousness: awake, alert , oriented, drowsy and patient cooperative  Airway & Oxygen Therapy: Patient Spontanous Breathing and Patient connected to nasal cannula oxygen  Post-op Assessment: Report given to RN and Post -op Vital signs reviewed and stable  Post vital signs: Reviewed and stable  Last Vitals:  Vitals Value Taken Time  BP 168/93 02/15/20 0909  Temp    Pulse 87 02/15/20 0911  Resp 13 02/15/20 0911  SpO2 100 % 02/15/20 0911  Vitals shown include unvalidated device data.  Last Pain:  Vitals:   02/15/20 0710  TempSrc: Oral  PainSc: 0-No pain      Patients Stated Pain Goal: 4 (44/96/75 [id number])  Complications: No complications documented.

## 2020-02-15 NOTE — Discharge Instructions (Addendum)
Hysteroscopy, Care After This sheet gives you information about how to care for yourself after your procedure. Your health care provider may also give you more specific instructions. If you have problems or questions, contact your health care provider. What can I expect after the procedure? After the procedure, it is common to have:  Cramping.  Bleeding. This can vary from light spotting to menstrual-like bleeding. Follow these instructions at home: Activity  Rest for 1-2 days after the procedure.  Do not douche, use tampons, or have sex for 2 weeks after the procedure, or until your health care provider approves.  Do not drive for 24 hours after the procedure, or for as long as told by your health care provider.  Do not drive, use heavy machinery, or drink alcohol while taking prescription pain medicines. Medicines   Take over-the-counter and prescription medicines only as told by your health care provider.  Do not take aspirin during recovery. It can increase the risk of bleeding. General instructions  Do not take baths, swim, or use a hot tub until your health care provider approves. Take showers instead of baths for 2 weeks, or for as long as told by your health care provider.  To prevent or treat constipation while you are taking prescription pain medicine, your health care provider may recommend that you: ? Drink enough fluid to keep your urine clear or pale yellow. ? Take over-the-counter or prescription medicines. ? Eat foods that are high in fiber, such as fresh fruits and vegetables, whole grains, and beans. ? Limit foods that are high in fat and processed sugars, such as fried and sweet foods.  Keep all follow-up visits as told by your health care provider. This is important. Contact a health care provider if:  You feel dizzy or lightheaded.  You feel nauseous.  You have abnormal vaginal discharge.  You have a rash.  You have pain that does not get better with  medicine.  You have chills. Get help right away if:  You have bleeding that is heavier than a normal menstrual period.  You have a fever.  You have pain or cramps that get worse.  You develop new abdominal pain.  You faint.  You have pain in your shoulders.  You have shortness of breath. Summary  After the procedure, you may have cramping and some vaginal bleeding.  Do not douche, use tampons, or have sex for 2 weeks after the procedure, or until your health care provider approves.  Do not take baths, swim, or use a hot tub until your health care provider approves. Take showers instead of baths for 2 weeks, or for as long as told by your health care provider.  Report any unusual symptoms to your health care provider.  Keep all follow-up visits as told by your health care provider. This is important. This information is not intended to replace advice given to you by your health care provider. Make sure you discuss any questions you have with your health care provider. Document Revised: 08/05/2017 Document Reviewed: 09/21/2016 Elsevier Patient Education  [place].  No advil, aleve, motrin, ibuprofen until 3:45 pm today  Post Anesthesia Home Care Instructions  Activity: Get plenty of rest for the remainder of the day. A responsible adult should stay with you for 24 hours following the procedure.  For the next 24 hours, DO NOT: -Drive a car -Paediatric nurse -Drink alcoholic beverages -Take any medication unless instructed by your physician -Make any legal decisions or  sign important papers.  Meals: Start with liquid foods such as gelatin or soup. Progress to regular foods as tolerated. Avoid greasy, spicy, heavy foods. If nausea and/or vomiting occur, drink only clear liquids until the nausea and/or vomiting subsides. Call your physician if vomiting continues.  Special Instructions/Symptoms: Your throat may feel dry or sore from the anesthesia or the  breathing tube placed in your throat during surgery. If this causes discomfort, gargle with warm salt water. The discomfort should disappear within 24 hours.  If you had a scopolamine patch placed behind your ear for the management of post- operative nausea and/or vomiting:  1. The medication in the patch is effective for 72 hours, after which it should be removed.  Wrap patch in a tissue and discard in the trash. Wash hands thoroughly with soap and water. 2. You may remove the patch earlier than 72 hours if you experience unpleasant side effects which may include dry mouth, dizziness or visual disturbances. 3. Avoid touching the patch. Wash your hands with soap and water after contact with the patch.

## 2020-02-15 NOTE — Anesthesia Postprocedure Evaluation (Signed)
Anesthesia Post Note  Patient: Amanda Brewer  Procedure(s) Performed: DILATATION & CURETTAGE/HYSTEROSCOPY WITH MYOSURE (N/A )     Patient location during evaluation: PACU Anesthesia Type: General Level of consciousness: awake Pain management: pain level controlled Vital Signs Assessment: post-procedure vital signs reviewed and stable Respiratory status: spontaneous breathing Cardiovascular status: stable Postop Assessment: no apparent nausea or vomiting Anesthetic complications: no   No complications documented.  Last Vitals:  Vitals:   02/15/20 0945 02/15/20 1000  BP: (!) 173/96 (!) 156/86  Pulse: 72 70  Resp: 12 11  Temp:    SpO2: 99% 97%    Last Pain:  Vitals:   02/15/20 1000  TempSrc:   PainSc: 3                  [employer]

## 2020-02-15 NOTE — Anesthesia Procedure Notes (Signed)
Procedure Name: LMA Insertion Date/Time: 02/15/2020 8:42 AM Performed by: Myna Bright, CRNA Pre-anesthesia Checklist: Patient identified, Emergency Drugs available, Suction available and Patient being monitored Patient Re-evaluated:Patient Re-evaluated prior to induction Oxygen Delivery Method: Circle system utilized Preoxygenation: Pre-oxygenation with 100% oxygen Induction Type: IV induction Ventilation: Mask ventilation without difficulty LMA: LMA inserted LMA Size: 4.0 Tube type: Oral Number of attempts: 1 Placement Confirmation: positive ETCO2 and breath sounds checked- equal and bilateral Tube secured with: Tape Dental Injury: Teeth and Oropharynx as per pre-operative assessment

## 2020-02-15 NOTE — Op Note (Signed)
Operative Note  02/15/2020  9:06 AM  PATIENT:  Amanda Brewer  59 y.o. female  PRE-OPERATIVE DIAGNOSIS:  Post menopausal bleeding, Endometrial polyps  POST-OPERATIVE DIAGNOSIS:  Post menopausal bleeding, Endometrial thickening, Endocervical polyps  PROCEDURE:  Procedure(s): DILATATION & CURETTAGE/HYSTEROSCOPY WITH MYOSURE EXCISION  SURGEON:  Surgeon(s): [name], MD  ANESTHESIA:   general  FINDINGS: Ostia well seen.  Endometrial thickening at the posterior lower uterine segment, Endocervical Polyps.  DESCRIPTION OF OPERATION: Under general anesthesia with laryngeal mask, the patient is in lithotomy position.  She is prepped with Betadine on the suprapubic, vulvar and vaginal areas.  The bladder is catheterized.  The patient is draped as usual.  Timeout is done.  Vaginal exam reveals an anteverted uterus, normal volume, mobile.  No adnexal mass felt.  The speculum is inserted in the vagina and the anterior lip of the cervix is grasped with a tenaculum.  A paracervical block is done with lidocaine 1% a total of 20 cc at 4 and 8:00.  Dilation of the cervix with Pratt dilators up to #19 without difficulty.  The hysteroscope is inserted in the intra uterine cavity.  Inspection is done.  Both ostia are well seen.  The intra uterine cavity shows a mildly thickened endometrium at the posterior lower uterine segment.  No polyp is seen in the intra uterine cavity.  In the endocervical canal 2 small polyps are present.  Pictures are taken.  The light MyoSure is inserted.  The thickened endometrium is resected.  We then excised the endocervical polyps.  Hemostasis is adequate.  Pictures are taken after resection and excision.  The hysteroscope with MyoSure are removed.  We proceeded with a systematic curettage of the intra uterine cavity on all surfaces with a sharp curette.  Both specimens are sent together to pathology.  All instruments are removed.  Hemostasis is adequate.  The speculum is removed.   The patient is brought to recovery room in good and stable status.  ESTIMATED BLOOD LOSS: 5 mL   Intake/Output Summary (Last 24 hours) at 02/15/2020 0906 Last data filed at 02/15/2020 0901 Gross per 24 hour  Intake 700 ml  Output 105 ml  Net 595 ml     BLOOD ADMINISTERED:none   LOCAL MEDICATIONS USED:  LIDOCAINE   SPECIMEN:  Source of Specimen:  Excision material and curettings Endometrium, Endocervical Polyps  DISPOSITION OF SPECIMEN:  PATHOLOGY  COUNTS:  YES  PLAN OF CARE: Transfer to PACU  Marie-Lyne LavoieMD9:06 AM

## 2020-02-15 NOTE — Anesthesia Preprocedure Evaluation (Signed)
Anesthesia Evaluation  Patient identified by MRN, date of birth, ID band Patient awake    Reviewed: Allergy & Precautions, H&P , NPO status , Patient's Chart, lab work & pertinent test results  Airway Mallampati: II  TM Distance: >3 FB Neck ROM: Full    Dental no notable dental hx. (+) Teeth Intact, Dental Advisory Given   Pulmonary    Pulmonary exam normal breath sounds clear to auscultation       Cardiovascular hypertension, negative cardio ROS   Rhythm:Regular Rate:Normal     Neuro/Psych negative psych ROS   GI/Hepatic Neg liver ROS,   Endo/Other  negative endocrine ROS  Renal/GU   negative genitourinary   Musculoskeletal  (+) Arthritis , Rheumatoid disorders,    Abdominal (+) + obese,   Peds  Hematology negative hematology ROS (+)   Anesthesia Other Findings   Reproductive/Obstetrics negative OB ROS                             Anesthesia Physical  Anesthesia Plan  ASA: II  Anesthesia Plan: General   Post-op Pain Management:    Induction: Intravenous  PONV Risk Score and Plan: 4 or greater and Ondansetron, Dexamethasone and Midazolam  Airway Management Planned: LMA  Additional Equipment: None  Intra-op Plan:   Post-operative Plan: Extubation in OR  Informed Consent: I have reviewed the patients History and Physical, chart, labs and discussed the procedure including the risks, benefits and alternatives for the proposed anesthesia with the patient or authorized representative who has indicated his/her understanding and acceptance.     Dental advisory given  Plan Discussed with: CRNA  Anesthesia Plan Comments:         Anesthesia Quick Evaluation

## 2020-02-15 NOTE — H&P (Signed)
Amanda Brewer is an 59 y.o. female.[id number]   RP: HSC/Myosure excision/D+C  HPI: PMB on HRT with probable polyps on Pelvic US 01/15/2020.  Stopped Estradiol patch, still taking the Prometrium.  Mild vaginal bleeding currently.  No pelvic pain.   Pertinent Gynecological History: Menses: post-menopausal Blood transfusions: none Sexually transmitted diseases: no past history Last mammogram: normal  Last pap: normal    Menstrual History:  No LMP recorded. Patient is postmenopausal.    Past Medical History:  Diagnosis Date  . Arthritis   . Cancer of left kidney (HCC)    stage 1  . Carpal tunnel syndrome of right wrist   . Endometrial polyp   . Gallstones   . GERD (gastroesophageal reflux disease)    occ.   Marland Kitchen Hearing loss, right    uses hearing aide  . History of iron deficiency anemia    with pregnancy  . History of kidney stones   . Hyperlipidemia   . Hypertension    history of, no longer taking medication  . Irregular heart beats    history of  . Left renal mass   . Multiple thyroid nodules   . Obesity   . OSA on CPAP   . PMB (postmenopausal bleeding)   . RA (rheumatoid arthritis) ([facility])   . Ruptured appendix   . Seasonal allergies   . Uses hearing aid   . Wears glasses     Past Surgical History:  Procedure Laterality Date  . DILATION AND CURETTAGE OF UTERUS    . KIDNEY SURGERY Left 2018  . LAPAROSCOPIC APPENDECTOMY N/A 05/13/2017   Procedure: APPENDECTOMY LAPAROSCOPIC;  Surgeon: Rolm Bookbinder, MD;  Location: [facility];  Service: General;  Laterality: N/A;  . TUBAL LIGATION      Family History  Problem Relation Age of Onset  . Hypertension Mother   . Osteoarthritis Mother   . Heart attack Father   . Stroke Father   . Alzheimer's disease Father   . Hypertension Father   . Heart attack Brother     Social History:  reports that she has never smoked. She has never used smokeless tobacco. She reports that she does not drink alcohol and does not use  drugs.  Allergies:  Allergies  Allergen Reactions  . Latex Rash    Bandaides    Medications Prior to Admission  Medication Sig Dispense Refill Last Dose  . aspirin 81 MG chewable tablet Chew by mouth daily.   Past Week at Unknown time  . atorvastatin (LIPITOR) 10 MG tablet Take 10 mg by mouth daily.   02/14/2020 at Unknown time  . Calcium Carb-Cholecalciferol (CALCIUM 600 + D) 600-200 MG-UNIT TABS Take by mouth 2 (two) times daily.    02/14/2020 at Unknown time  . etanercept (ENBREL) 50 MG/ML injection Inject 50 mg into the skin once a week.   Past Week at Unknown time  . hydroxychloroquine (PLAQUENIL) 200 MG tablet Take 200 mg by mouth 2 (two) times daily.    02/14/2020 at Unknown time  . megestrol (MEGACE) 40 MG tablet TAKE 1 TABLET BY MOUTH TWICE A DAY 60 tablet 0 02/14/2020 at Unknown time  . montelukast (SINGULAIR) 10 MG tablet Take 10 mg by mouth daily. At supper   02/14/2020 at Unknown time  . progesterone (PROMETRIUM) 100 MG capsule Take 1 capsule (100 mg total) by mouth at bedtime. 90 capsule 4 02/14/2020 at Unknown time  . sertraline (ZOLOFT) 100 MG tablet Take 150 mg by mouth every morning.  02/14/2020 at Unknown time  . estradiol (CLIMARA - DOSED IN MG/24 HR) 0.05 mg/24hr patch Place 1 patch (0.05 mg total) onto the skin once a week. (Patient not taking: Reported on 02/01/2020) 12 patch 4     REVIEW OF SYSTEMS: A ROS was performed and pertinent positives and negatives are included in the history.  GENERAL: No fevers or chills. HEENT: No change in vision, no earache, sore throat or sinus congestion. NECK: No pain or stiffness. CARDIOVASCULAR: No chest pain or pressure. No palpitations. PULMONARY: No shortness of breath, cough or wheeze. GASTROINTESTINAL: No abdominal pain, nausea, vomiting or diarrhea, melena or bright red blood per rectum. GENITOURINARY: No urinary frequency, urgency, hesitancy or dysuria. MUSCULOSKELETAL: No joint or muscle pain, no back pain, no recent trauma.  DERMATOLOGIC: No rash, no itching, no lesions. ENDOCRINE: No polyuria, polydipsia, no heat or cold intolerance. No recent change in weight. HEMATOLOGICAL: No anemia or easy bruising or bleeding. NEUROLOGIC: No headache, seizures, numbness, tingling or weakness. PSYCHIATRIC: No depression, no loss of interest in normal activity or change in sleep pattern.     Blood pressure (!) 141/85, pulse 74, temperature 98.8 F (37.1 C), temperature source Oral, resp. rate 18, height 5\' 2"  (1.575 m), weight 87.1 kg, SpO2 100 %.  Physical Exam:  See office notes   Results for orders placed or performed during the hospital encounter of 02/15/20 (from the past 24 hour(s))  CBC     Status: None   Collection Time: 02/15/20  6:53 AM  Result Value Ref Range   WBC 6.5 4.0 - 10.5 K/uL   RBC 4.81 3.87 - 5.11 MIL/uL   Hemoglobin 14.9 12.0 - 15.0 g/dL   HCT 44.0 36 - 46 %   MCV 91.5 80.0 - 100.0 fL   MCH 31.0 26.0 - 34.0 pg   MCHC 33.9 30.0 - 36.0 g/dL   RDW 12.2 11.5 - 15.5 %   Platelets 151 150 - 400 K/uL   nRBC 0.0 0.0 - 0.2 %   Covid Negative  Pelvic US 01/15/2020: T/V images. Anteverted uterus normal in size and shape with no myometrial mass. The uterus is measured at 7.48 x 4.78 x 3.94 cm. The endometrial lining is thickened and asymmetrical with cystic changes, measured at 5.05 mm. A feeder vessel is identified to a 9 x 6 mm endometrial mass. 3D imaging confirms an intracavitary lesion. A second mass is visualized within the cervical canal measured at 6 x 3 mm with a feeder vessel identified. Both ovaries are small with atrophic appearance. No adnexal mass. No free fluid in the posterior cul-de-sac.   Assessment/Plan:  59 y.o. G2P0011   1. Post-menopausal bleeding Postmenopausal bleeding on hormone replacement therapy.  Pelvic ultrasound on Jan 15, 2020 showed a small endometrial polyp and an endocervical polyp.  Patient stopped her estradiol patch and is still taking Prometrium.  We will  proceed with hysteroscopy, MyoSure excision of intra uterine lesions and D&C.  Preop preparation, procedure with risks and postop precautions and expectations thoroughly reviewed with patient.  Patient voiced understanding and agreement with plan.  2. Endometrial polyp As above.                        Patient was counseled as to the risk of surgery to include the following:  1. Infection (prohylactic antibiotics will be administered)  2. DVT/Pulmonary Embolism (prophylactic pneumo compression stockings will be used)  3.Trauma to internal organs requiring additional surgical procedure to  repair any injury to internal organs requiring perhaps additional hospitalization days.  4.Hemmorhage requiring transfusion and blood products which carry risks such as anaphylactic reaction, hepatitis and AIDS  Patient had received literature information on the procedure scheduled and all her questions were answered and fully accepts all risk.   Marie-Lyne Lavoie 02/15/2020, 8:03 AM

## 2020-02-18 ENCOUNTER — Encounter (HOSPITAL_BASED_OUTPATIENT_CLINIC_OR_DEPARTMENT_OTHER): Payer: Self-pay | Admitting: Obstetrics & Gynecology

## 2020-02-18 LAB — SURGICAL PATHOLOGY

## 2020-02-21 ENCOUNTER — Ambulatory Visit: Payer: Managed Care, Other (non HMO)

## 2020-02-21 ENCOUNTER — Other Ambulatory Visit: Payer: Self-pay

## 2020-02-21 ENCOUNTER — Encounter: Payer: Self-pay | Admitting: Counselor

## 2020-02-21 ENCOUNTER — Ambulatory Visit (INDEPENDENT_AMBULATORY_CARE_PROVIDER_SITE_OTHER): Payer: Managed Care, Other (non HMO) | Admitting: Counselor

## 2020-02-21 DIAGNOSIS — F32A Depression, unspecified: Secondary | ICD-10-CM

## 2020-02-21 DIAGNOSIS — F329 Major depressive disorder, single episode, unspecified: Secondary | ICD-10-CM

## 2020-02-21 DIAGNOSIS — F809 Developmental disorder of speech and language, unspecified: Secondary | ICD-10-CM

## 2020-02-21 DIAGNOSIS — F09 Unspecified mental disorder due to known physiological condition: Secondary | ICD-10-CM

## 2020-02-21 NOTE — Progress Notes (Signed)
   Psychometrist Note   Cognitive testing was administered to Amanda Brewer by Lamar Benes, B.S. (Technician) under the supervision of Alphonzo Severance, Psy.D., ABN. [name] was able to tolerate all test procedures. Dr. Nicole Kindred met with the patient as needed to manage any emotional reactions to the testing procedures (if applicable). Rest breaks were offered.    The battery of tests administered was selected by Dr. Nicole Kindred with consideration to the patient's current level of functioning, the nature of her symptoms, emotional and behavioral responses during the interview, level of literacy, observed level of motivation/effort, and the nature of the referral question. This battery was communicated to the psychometrist. Communication between Dr. Nicole Kindred and the psychometrist was ongoing throughout the evaluation and Dr. Nicole Kindred was immediately accessible at all times. Dr. Nicole Kindred provided supervision to the technician on the date of this service, to the extent necessary to assure the quality of all services provided.    [name] will return in approximately one week for an interactive feedback session with Dr. Nicole Kindred, at which time female test performance, clinical impressions, and treatment recommendations will be reviewed in detail. The patient understands she can contact our office should she require our assistance before this time.   A total of 120 minutes of billable time were spent with Amanda Brewer by the technician, including test administration and scoring time. Billing for these services is reflected in Dr. Les Pou note.   This note reflects time spent with the psychometrician and does not include test scores, clinical history, or any interpretations made by Dr. Nicole Kindred. The full report will follow in a separate note.
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
## 2020-02-25 NOTE — Progress Notes (Signed)
[facility]  Patient Name: Amanda Brewer MRN: [id number] Date of Birth: 1960/10/15 Age: 59 y.o. Education: 16 years  Measurement properties of test scores: IQ, Index, and Standard Scores (SS): Mean = 100; Standard Deviation = 15 Scaled Scores (Ss): Mean = 10; Standard Deviation = 3 Z scores (Z): Mean = 0; Standard Deviation = 1 T scores (T); Mean = 50; Standard Deviation = 10  TEST SCORES:    Note: This summary of test scores accompanies the interpretive report and should not be interpreted by unqualified individuals or in isolation without reference to the report. Test scores are relative to age, gender, and educational history as available and appropriate.   Performance Validity        "A" Random Letter Test Raw  Descriptor      Errors 1 Within Expectation  The Dot Counting Test: 8 Within Expectation      Mental Status Screening     Total Score Descriptor  MoCA 27 Normal      Expected Functioning        Wide Range Achievement Test: Standard/Scaled Score Percentile      Word Reading 97 42      Reynolds Intellectual Screening Test Standard/T-score Percentile      Guess What 45 31      Odd Item Out 49 47  RIST Index 96 40      Attention/Processing Speed        Neuropsychological Assessment Battery (Attention Module, Form 1): Scaled/T-score Percentile     Digits Forward 25 1     Digits Backwards 44 27      Repeatable Battery for the Assessment of Neuropsychological Status (Form A): Standard Score Percentile     Coding 6 9      Language        Neuropsychological Assessment Battery (Language Module, Form 1): T-score Percentile      Naming   (31) 53 62      Verbal Fluency:  T-score Percentile      Controlled Oral Word Association (F-A-S) 48 42      Semantic Fluency (Animals) 37 9      Memory:        Neuropsychological Assessment Battery (Memory Module, Form 1): T-score/Standard Score Percentile  Memory Index (MEM): 91 27       List Learning           List A Immediate Recall   (1 , 6 , 11) 32 4         List B Immediate Recall   (4) 41 18         List A Short Delayed Recall   (8) 46 34         List A Long Delayed Recall   (7) 41 18         List A Long Delayed Yes/No Recognition Hits   (10) --- 27         List A Long Delayed Yes/No Recognition False Alarms   (3) --- 38         List A Recognition Discriminability Index --- 31      Shape Learning           Immediate Recognition   (7 , 8 , 8) 65 93         Delayed Recognition   (7) 54 66         Delayed Forced-Choice Recognition Hits   (9) --- 86  Delayed Forced-Choice Recognition False Alarms   (0) --- 73         Delayed Forced-Choice Recognition Discriminability --- 68     Story Learning           Immediate Recall   (12, 39) 33 5         Delayed Recall   (37) 53 62      Daily Living Memory            Immediate Recall   (21, 18) 36 8          Delayed Recall   (9, 8) 57 75          Recognition Hits   (9) --- 50      Repeatable Battery for the Assessment of Neuropsychological Status (Form A): Scaled Score Percentile         Figure Recall   (16) 12 75      Visuospatial/Constructional Functioning        Repeatable Battery for the Assessment of Neuropsychological Status (Form A): Standard/Scaled Score Percentile      Visuospatial/Constructional Index 126 96          Figure Copy 13 84          Judgment of Line Orientation --- >75      Executive Functioning        Modified Wisconsin Card Sorting Test (MWCST): Standard/T-Score Percentile      Number of Categories Correct 52 58      Number of Perseverative Errors 58 79      Number of Total Errors 60 85      Percent Perseverative Errors 59 82  Executive Function Composite 108 71      Trail Making Test: T-Score Percentile      Part A 72 99      Part B 46 34      Boston Diagnostic Aphasia Exam: Raw Score Scaled Score      Complex Ideational Material 12 12      Clock Drawing Raw Score Descriptor       Command 10 WNL      Rating Scales         Raw Score Descriptor  Patient Health Questionnaire - 9 12 Moderate Depression  GAD-7 4 Minimal    Jillene Wehrenberg V. Nicole Kindred PsyD, [place] Clinical Neuropsychologist

## 2020-02-28 ENCOUNTER — Encounter: Payer: Managed Care, Other (non HMO) | Admitting: Counselor

## 2020-03-06 ENCOUNTER — Encounter: Payer: Self-pay | Admitting: Counselor

## 2020-03-06 ENCOUNTER — Other Ambulatory Visit: Payer: Self-pay

## 2020-03-06 ENCOUNTER — Ambulatory Visit (INDEPENDENT_AMBULATORY_CARE_PROVIDER_SITE_OTHER): Payer: Managed Care, Other (non HMO) | Admitting: Counselor

## 2020-03-06 DIAGNOSIS — F418 Other specified anxiety disorders: Secondary | ICD-10-CM | POA: Diagnosis not present

## 2020-03-06 NOTE — Patient Instructions (Signed)
Your performance and presentation on neuropsychological testing appears quite similar to your previous performances with Dr. Dionne Milo and Dr. Rica Mote. This is with the caveat that you did have some low scores on memory measures suggesting difficulties with efficiently and effectively encoding information. The best term for this type of problem is an executive control issue. Executive control is a higher order cognitive ability involved in regulating other cognitive resources. Much like the conductor of an orchestra coordinates multiple instruments to make music, executive capacities coordinate other lower-order skills (e.g., movement, language, attention) to form complex human behaviors. Individuals with executive control problems are often capable of doing most of the things they did before they were having problems, but they may not do so as effortlessly, efficiently, and consistently. These difficulties often manifest as problems tracking information, multitasking, and paying attention. Executive control problems often result in cognitive inefficiency and can present as "memory problems," because they decrease encoding and spontaneous retrieval of information.   My best guess is that your executive control difficulties are related to your mood issues. Depression can affect cognitive functioning in several ways. For one, there are neurobiological changes in depression that can contribute to attention, concentration, and memory problems. These changes are so common they are part of the criteria we use to diagnose depressive disorders. Depression can also negatively impact your own appraisal of your cognitive abilities, leading you to feel like you are performing more poorly than is objectively warranted. I suggest that you continue to actively work on treating your depression, because that may help.  I think there is an extremely low likelihood that your difficulties are due to a progressive neurological  condition such as Alzheimer's disease or a developing dementia syndrome. I would like to reassure you that your test findings do not look like what we would expect if that were the cause.   Some cognitive abilities (such as processing speed) naturally decline with age, but there are many things you can do to contribute to healthy cognitive aging. There is evidence from at least one study that a modified low carbohydrate mediterranean diet (the MIND-DASH) diet can contribute to healthy cognitive aging. There is also some evidence to suggest a beneficial effect of coffee (black coffee without sugar or other additives such as cream) for healthy aging. One glass of red wine a day may also contribute to healthy aging though at two drinks you lose all benefit and at three drinks it may be doing more harm than good. Staying active, mentally and physically, are also crucially important. One of the best ways to do this is simply to stay active and engaged in your life, particularly with social activities. Challenging the mind and other cognitively stimulating activities are encouraged, consider learning a new hobby, reconnecting with old friends, reading an interesting and thought provoking book. It is not so much what you do that is important as it is that you enjoy it and stay at it.   We discussed the importance of counseling, how overfocus and unhelpful communication about cognitive problems can serve to magnify them, and you will work on this in your psychotherapeutic treatment.

## 2020-03-06 NOTE — Progress Notes (Signed)
[facility]  Patient Name: Amanda Brewer MRN: [id number] Date of Birth: 10/20/1960 Age: 59 y.o. Education: 36 years  Clinical Impressions  Amanda Brewer is a 58 y.o., right-hand dominant, married woman with a history of cognitive problems over at least the past 5 years (patient reported 3 years but records obtained suggest that she sought consultation in 2016). She has been evaluated neuropsychologically on two occasions, by Dr. Rica Mote in 2016 and by Dr. Dionne Milo with [facility] in 2018. Both practitioners noted poor single trial learning on memory tasks but felt that her profile was, essentially, unremarkable. Her day-to-day problems involve mainly executive control difficulties such as getting distracted in the middle of tasks, difficulties focusing and concentrating, misplacing things, and also problems communicating (specifically about her feelings and about personal matters such as her relationship). There are some marital issues, her husband feels like she is distant and doesn't share herself as much as he would like. She may have diminished efficiency as a result of cognitive problems but has no significant impairment in day-to-day functioning. She is still working in [employer], is paying her bills on time, and is managing her own medications and driving.   Neuropsychological test performance can be summed up as reflecting difficulties with attention and executive control manifesting in scattered low memory scores, inconsistent performance on attention measures, and diminished processing efficiency. While the number of low memory test scores is abnormal for an individual of her ability level, her overall memory performance level is within normal limits, and the profile is not consistent with a memory storage type problem. There are no significant changes in her memory performance at an index level as compared to her first evaluation in 2016. Likewise, there do not  appear to be any compelling patterns of decreased performance in other areas concerning for a decline, although there were some scattered lower test scores. She reported moderate levels of depressive symptoms (many of them somatic) and minimal levels of anxiety symptoms.   Presentation viewed as consistent with executive control problems, which may to some extent be preexisting. I think the likelihood that this is due to a progressive condition is low and her test performance does not appear to have changed significantly over the past 6 years. It seems likely that her increased difficulties with depression may be exacerbating preexisting attentional issues and/or contributing to overfocus on and negative appraisal of cognitive ability. Relationship dynamics are also likely contributing.   Diagnostic Impressions: Other symptoms and signs involving cognitive functions and awareness Depression with anxiety   Recommendations to be discussed with patient  Your performance and presentation on neuropsychological testing appears quite similar to your previous performances with Dr. Dionne Milo and Dr. Rica Mote. This is with the caveat that you did have some low scores on memory measures suggesting difficulties with efficiently and effectively encoding information. The best term for this type of problem is an executive control issue. Executive control is a higher order cognitive ability involved in regulating other cognitive resources. Much like the conductor of an orchestra coordinates multiple instruments to make music, executive capacities coordinate other lower-order skills (e.g., movement, language, attention) to form complex human behaviors. Individuals with executive control problems are often capable of doing most of the things they did before they were having problems, but they may not do so as effortlessly, efficiently, and consistently. These difficulties often manifest as problems tracking information,  multitasking, and paying attention. Executive control problems often result in cognitive inefficiency and can present as "memory problems,"  because they decrease encoding and spontaneous retrieval of information.   My best guess is that your executive control difficulties are related to your mood issues. Depression can affect cognitive functioning in several ways. For one, there are neurobiological changes in depression that can contribute to attention, concentration, and memory problems. These changes are so common they are part of the criteria we use to diagnose depressive disorders. Depression can also negatively impact your own appraisal of your cognitive abilities, leading you to feel like you are performing more poorly than is objectively warranted. I suggest that you continue to actively work on treating your depression, because that may help. You are already involved in psychiatric treatment and you might consider augmenting that with psychotherapy.   Alexithymia is the term for when individuals have decreased access to and awareness of emotions. They often have a hard time talking about their feelings and identifying feelings. It sounds like you may have some elements of alexithymia and if that is the case, counseling could be particularly helpful, by helping you to gain greater access to and awareness of your emotions. Sometimes, this can manifest in relationships with others feeling as though the individual with alexithymia is "closed off" or "putting up a wall" and I wonder if that isn't causing some of the issue with your husband.   I think there is an extremely low likelihood that your difficulties are due to a progressive neurological condition such as Alzheimer's disease or a developing dementia syndrome. I would like to reassure you that your test findings do not look like what we would expect if that were the cause.   Some cognitive abilities (such as processing speed) naturally decline  with age, but there are many things you can do to contribute to healthy cognitive aging. There is evidence from at least one study that a modified low carbohydrate mediterranean diet (the MIND-DASH) diet can contribute to healthy cognitive aging. There is also some evidence to suggest a beneficial effect of coffee (black coffee without sugar or other additives such as cream) for healthy aging. One glass of red wine a day may also contribute to healthy aging though at two drinks you lose all benefit and at three drinks it may be doing more harm than good. Staying active, mentally and physically, are also crucially important. One of the best ways to do this is simply to stay active and engaged in your life, particularly with social activities. Challenging the mind and other cognitively stimulating activities are encouraged, consider learning a new hobby, reconnecting with old friends, reading an interesting and thought provoking book. It is not so much what you do that is important as it is that you enjoy it and stay at it.   Test Findings  Test scores are summarized in additional documentation associated with this encounter. Test scores are relative to age, gender, and educational history as available and appropriate. There were no concerns about performance validity as all findings fell within normal expectations.   General Intellectual Functioning/Achievement:  Performance on single word reading suggested average ability and that is commensurate with her RIST index, which also fell at an average level. Average scores were obtained on both the visually and verbally oriented subtest.   Attention and Processing Efficiency: Performance on indicators of attention and processing efficiency was weak with extremely low digit repetition forward and then average digit repetition backward. Performance on timed measures of processing efficiency was variable, she demonstrated unusually low timed number-symbol coding  whereas simple numeric  sequencing was very good and fell at a superior level.   Language: Performance on visual object confrontation naming was average. Timed verbal fluency indicators generated an average score for phonemic fluency and a low average score for semantic fluency.   Visuospatial Function: Performance on visuospatial and constructional measures was very good with a superior score on the overall index. Copy of a line drawing and judgment of angular line orientations were both high average.   Learning and Memory: Performance on measures of learning and memory fell at a reasonable average level overall. Nevertheless, the number of low subtest scores is unusual for an individual of average overall cognitive ability. The profile shows difficulties with memory encoding, which were also noted at her previous evaluations. I do not think there is a meaningful pattern of changes as compared to the previous evaluations with somewhat better performance in some areas and then weaker performance in other areas. There are no significant changes at an index level.   In the verbal realm, Ms. Cronin demonstrated somewhat weak unusually low single word reading, which is in large part due to very poor single trial learning. Nevertheless, she retained those words across time fairly well with an average score for short delayed recall and a low average score for long delayed recall. Yes/no recognition discrimination for words from the list versus foils was average. Her memory for a short story was similar with low single trial learning resulting in an unusually low score but then good retention across time and average delayed recall. The same pattern was seen when she was asked to remember brief daily-living type information with unusually low immediate recall and high average delayed recall.   In the visual realm, she did better, with superior range immediate recognition of a series of designs that are difficult  to verbally encode and average delayed recognition. Yes/no recognition for the designs versus false choices was high average. Delayed recall for a line drawing was very good, near the high average range.   Executive Functions: Performance on executive measures was largely within normal limits with a reasonable average (nearly high average) range score on the Executive Function Composite of a card sorting measure involving concept formation and flexibility in response to feedback. Alternating sequencing of numbers and letters of the alphabet was average. Generation of words in response to given letters was also average. Reasoning with verbal material was errorless on the Complex Ideational Material and clock drawing was within normal limits.   Rating Scale(s): Ms. Shelley reported moderate levels of depressive symptoms and minimal levels of anxiety symptoms on self-rating scales.   Viviano Simas Nicole Kindred PsyD, [place] Clinical Neuropsychologist

## 2020-03-06 NOTE — Progress Notes (Signed)
[facility]  Telemedicine statement:  I discussed the limitations of neuropsychological care via telemedicine and the availability of in person appointments. The patient expressed understanding and agreed to proceed. The patient was verified with two identifiers.  The visit modality was: telephonic The patient location was: home The provider location was: office  The following individuals participated: Koren Shiver  Feedback Note: I met with Amanda Brewer to review the findings resulting from her neuropsychological evaluation. Since the last appointment, she has been about the same. Her husband participated and I took the opportunity to get his take on her cognitive difficulties. He stated that he is "really concerned" about her, that he feels she has been continually declining over time, but that he also notices her issues in "spurts." He stated that she used to only notice her issues when he pointed them out; now, she has started noticing them spontaneously. He stated that she has problems with verbal communication (saying words in the right order and clearly expressing herself) but also that she is "not handling stress well." All this has made their "functional communication in the home a nightmare." I discussed with them that there is a fine line between being concerned and attentive to potential problems and being accusatory. Her cognitive profile remains unchanged from the previous evaluations at a domain level, and I do not think there is any likelihood that she has a degenerative condition. Rather, it seems that her mood has deteriorated and they may have some blocks in their communication about cognitive problems. The patient agreed and her husband has insight into this as well. She will continue with counseling and I suggested they consider couples counseling to deal on some of these communication issues. We reviewed her findings and the insidious  problems that can be created by overfocus on cognition. I encouraged Amanda Brewer to contact me should she have any further questions or if further follow up is desired.   Current Medications and Medical History   Current Outpatient Medications  Medication Sig Dispense Refill  . aspirin 81 MG chewable tablet Chew by mouth daily.    Marland Kitchen atorvastatin (LIPITOR) 10 MG tablet Take 10 mg by mouth daily.    . Calcium Carb-Cholecalciferol (CALCIUM 600 + D) 600-200 MG-UNIT TABS Take by mouth 2 (two) times daily.     Marland Kitchen etanercept (ENBREL) 50 MG/ML injection Inject 50 mg into the skin once a week.    . hydroxychloroquine (PLAQUENIL) 200 MG tablet Take 200 mg by mouth 2 (two) times daily.     . montelukast (SINGULAIR) 10 MG tablet Take 10 mg by mouth daily. At supper    . sertraline (ZOLOFT) 100 MG tablet Take 150 mg by mouth every morning.      No current facility-administered medications for this visit.    Patient Active Problem List   Diagnosis Date Noted  . Ruptured appendicitis 05/13/2017    Mental Status and Behavioral Observations  Amanda Brewer was available at the prespecified time for this telephonic appointment and was alert and generally oriented (orientation not formally assessed). Speech was normal in rate, rhythm, volume, and prosody. Self-reported mood was dysphoric and affect as assessed by vocal quality was congruent. Thought process was logical, linear, and goal-oriented and thought content was appropriate to the topics discussed. There were no safety concerns identified at today's encounter, such as thoughts of harming self or others.   Plan  Feedback provided regarding the patient's neuropsychological evaluation. She does not  appear to have any findings concerning for a degenerative disorder and has changed minimally over the past 6 years, making this more likely an affective/psychosocial issue. Her husband has some experience with counseling and has good insight that they do not always  communicate the best with one another surrounding these issues and that this may be compounding the problem. She will continue with counseling. Amanda Brewer was encouraged to contact me if any questions arise or if further follow up is desired.   Viviano Simas Nicole Kindred, PsyD, [employer] Clinical Neuropsychologist  Service(s) Provided at This Encounter: 32 minutes [phone]; Conjoint therapy with patient present)

## 2020-03-08 ENCOUNTER — Other Ambulatory Visit: Payer: Self-pay | Admitting: Obstetrics & Gynecology

## 2020-03-11 ENCOUNTER — Ambulatory Visit (INDEPENDENT_AMBULATORY_CARE_PROVIDER_SITE_OTHER): Payer: Managed Care, Other (non HMO) | Admitting: Obstetrics & Gynecology

## 2020-03-11 ENCOUNTER — Other Ambulatory Visit: Payer: Self-pay

## 2020-03-11 ENCOUNTER — Encounter: Payer: Self-pay | Admitting: Obstetrics & Gynecology

## 2020-03-11 VITALS — BP 126/82

## 2020-03-11 DIAGNOSIS — Z09 Encounter for follow-up examination after completed treatment for conditions other than malignant neoplasm: Secondary | ICD-10-CM | POA: Diagnosis not present

## 2020-03-11 DIAGNOSIS — F419 Anxiety disorder, unspecified: Secondary | ICD-10-CM | POA: Diagnosis not present

## 2020-03-11 DIAGNOSIS — Z780 Asymptomatic menopausal state: Secondary | ICD-10-CM

## 2020-03-11 NOTE — Progress Notes (Signed)
    Amanda Brewer August 06, 1961 [id number]        59 y.o.  G2P0011   RP: Post op HSC/Myosure excision/D+C on 02/15/2020  HPI: Very good postop recovery.  No vaginal bleeding x 1 week.  No vaginal d/c.  No pelvic pain.  No fever.  Stopped HRT which had been started 07/2019.  No hot flushes, minimal night sweats.  Continued anxiety.  Urine/BMs normal.   OB History  Gravida Para Term Preterm AB Living  2 1     1 1   SAB TAB Ectopic Multiple Live Births  1            # Outcome Date GA Lbr Len/2nd Weight Sex Delivery Anes PTL Lv  2 SAB           1 Para             Past medical history,surgical history, problem list, medications, allergies, family history and social history were all reviewed and documented in the EPIC chart.   Directed ROS with pertinent positives and negatives documented in the history of present illness/assessment and plan.  Exam:  Vitals:   03/11/20 1524  BP: 126/82   General appearance:  Normal  Abdomen: Normal  Gynecologic exam: Vulva normal.  Bimanual exam:  Uterus AV, normal volume, mobile, NT.  No adnexal mass/NT bilaterally.  No vaginal bleeding and no d/c.  FINAL MICROSCOPIC DIAGNOSIS:   A. ENDOMETRIUM, CURETTAGE:  - Endocervical-type polyp. Endometrioid-type polyp. No malignancy  identified.    Assessment/Plan:  59 y.o. G2P0011   1. Status post gynecological surgery, follow-up exam Very good postop healing with no complication.  Benign pathology reviewed with patient.  2. Postmenopause Minimal vasomotor menopausal symptoms.  Anxiety probably not due to menopause.  Decision not to start back on HRT.  3. Anxiety Recommend increased physical activities daily to release the Endorphins and improve fitness.  We will otherwise consult with family PA if medication needed.  Amanda Bruins MD, 3:30 PM 03/11/2020

## 2020-03-12 ENCOUNTER — Encounter: Payer: Self-pay | Admitting: Obstetrics & Gynecology

## 2020-03-12 NOTE — Patient Instructions (Signed)
1. Status post gynecological surgery, follow-up exam Very good postop healing with no complication.  Benign pathology reviewed with patient.  2. Postmenopause Minimal vasomotor menopausal symptoms.  Anxiety probably not due to menopause.  Decision not to start back on HRT.  3. Anxiety Recommend increased physical activities daily to release the Endorphins and improve fitness.  We will otherwise consult with family PA if medication needed.  Amanda Brewer, it was a pleasure seeing you today!

## 2020-03-25 ENCOUNTER — Encounter: Payer: Managed Care, Other (non HMO) | Admitting: Counselor

## 2020-04-01 ENCOUNTER — Encounter: Payer: Managed Care, Other (non HMO) | Admitting: Counselor

## 2020-04-02 ENCOUNTER — Emergency Department
Admission: EM | Admit: 2020-04-02 | Discharge: 2020-04-02 | Disposition: A | Discharge status: Home / Self Care | Payer: Managed Care, Other (non HMO) | Source: Home / Self Care | Attending: Family Medicine | Admitting: Family Medicine

## 2020-04-02 ENCOUNTER — Other Ambulatory Visit: Payer: Self-pay

## 2020-04-02 ENCOUNTER — Emergency Department (INDEPENDENT_AMBULATORY_CARE_PROVIDER_SITE_OTHER): Payer: Managed Care, Other (non HMO)

## 2020-04-02 DIAGNOSIS — R109 Unspecified abdominal pain: Secondary | ICD-10-CM | POA: Diagnosis not present

## 2020-04-02 DIAGNOSIS — R0789 Other chest pain: Secondary | ICD-10-CM

## 2020-04-02 DIAGNOSIS — R1032 Left lower quadrant pain: Secondary | ICD-10-CM | POA: Diagnosis not present

## 2020-04-02 DIAGNOSIS — R0781 Pleurodynia: Secondary | ICD-10-CM | POA: Diagnosis not present

## 2020-04-02 DIAGNOSIS — Z905 Acquired absence of kidney: Secondary | ICD-10-CM

## 2020-04-02 LAB — POCT URINALYSIS DIP (MANUAL ENTRY)
Bilirubin, UA: NEGATIVE
Blood, UA: NEGATIVE
Glucose, UA: NEGATIVE mg/dL
Leukocytes, UA: NEGATIVE
Nitrite, UA: NEGATIVE
Spec Grav, UA: >=1.03 — AB (ref 1.010–1.025)
Urobilinogen, UA: 0.2 E.U./dL
pH, UA: 6 (ref 5.0–8.0)

## 2020-04-02 MED ORDER — KETOROLAC TROMETHAMINE 60 MG/2ML IM SOLN
60.0000 mg | Freq: Once | INTRAMUSCULAR | Status: AC
  Administered 2020-04-02: 60 mg via INTRAMUSCULAR

## 2020-04-02 MED ORDER — ACETAMINOPHEN 500 MG PO TABS
1000.0000 mg | ORAL_TABLET | Freq: Four times a day (QID) | ORAL | Status: DC | PRN
  Administered 2020-04-02: 1000 mg via ORAL

## 2020-04-02 MED ORDER — PREDNISONE 20 MG PO TABS
ORAL_TABLET | ORAL | 0 refills | Status: DC
Start: 2020-04-02 — End: 2021-01-13

## 2020-04-02 NOTE — ED Triage Notes (Signed)
Patient presents to Urgent Care with complaints of left posterior back pain since the past 4-5 days ago. Patient reports it seemed to get better but then the pain returned worse yesterday. Pt states she has been told in the past that she has stones in her kidney; has not passed one in the past. Pt denies urinary sx.

## 2020-04-02 NOTE — ED Provider Notes (Signed)
Amanda Brewer CARE    CSN: [id number] Arrival date & time: 04/02/20  1443      History   Chief Complaint Chief Complaint  Patient presents with  . Back Pain    HPI Amanda Brewer is a 59 y.o. female.   Four days ago while at work patient suddenly developed left posterior/inferior chest and flank pain.  She recalls no injury or change in activities.  She denies cough, hematuria, other urinary symptoms, fevers, chills, and sweats, and nausea/vomiting.  The pain has not changed positions and seemed to improve until yesterday.  The pain is worse when standing and a heating pad helps somewhat.  She has been told in the past that she has stones in her kidney but has never passed one.  She is concerned because of a history of left kidney cancer and partial left nephrectomy.    The history is provided by the patient.    Past Medical History:  Diagnosis Date  . Arthritis   . Cancer of left kidney (HCC)    stage 1  . Carpal tunnel syndrome of right wrist   . Endometrial polyp   . Gallstones   . GERD (gastroesophageal reflux disease)    occ.   Marland Kitchen Hearing loss, right    uses hearing aide  . History of iron deficiency anemia    with pregnancy  . History of kidney stones   . Hyperlipidemia   . Hypertension    history of, no longer taking medication  . Irregular heart beats    history of  . Left renal mass   . Multiple thyroid nodules   . Obesity   . OSA on CPAP   . PMB (postmenopausal bleeding)   . RA (rheumatoid arthritis) ([place])   . Ruptured appendix   . Seasonal allergies   . Uses hearing aid   . Wears glasses     Patient Active Problem List   Diagnosis Date Noted  . Ruptured appendicitis 05/13/2017    Past Surgical History:  Procedure Laterality Date  . DILATATION & CURETTAGE/HYSTEROSCOPY WITH MYOSURE N/A 02/15/2020   Procedure: DILATATION & CURETTAGE/HYSTEROSCOPY WITH MYOSURE;  Surgeon: [name], MD;  Location: [facility];  Service:  Gynecology;  Laterality: N/A;  request 8:30am OR time in iqueue held time for [facility] requests one hour OR time  . DILATION AND CURETTAGE OF UTERUS    . KIDNEY SURGERY Left 2018  . LAPAROSCOPIC APPENDECTOMY N/A 05/13/2017   Procedure: APPENDECTOMY LAPAROSCOPIC;  Surgeon: Rolm Bookbinder, MD;  Location: [facility];  Service: General;  Laterality: N/A;  . TUBAL LIGATION      OB History    Gravida  2   Para  1   Term      Preterm      AB  1   Living  1     SAB  1   TAB      Ectopic      Multiple      Live Births               Home Medications    Prior to Admission medications   Medication Sig Start Date End Date Taking? Authorizing Provider  aspirin 81 MG chewable tablet Chew by mouth daily.    [provider]  atorvastatin (LIPITOR) 10 MG tablet Take 10 mg by mouth daily.    [provider]  Calcium Carb-Cholecalciferol (CALCIUM 600 + D) 600-200 MG-UNIT TABS Take by mouth 2 (two)  times daily.     [provider]  etanercept (ENBREL) 50 MG/ML injection Inject 50 mg into the skin once a week.    [provider]  hydroxychloroquine (PLAQUENIL) 200 MG tablet Take 200 mg by mouth 2 (two) times daily.     [provider]  montelukast (SINGULAIR) 10 MG tablet Take 10 mg by mouth daily. At supper    [provider]  predniSONE (DELTASONE) 20 MG tablet Take one tab by mouth twice daily for 4 days, then one daily. Take with food. 04/02/20   [name], MD  sertraline (ZOLOFT) 100 MG tablet Take 150 mg by mouth every morning.     [provider]    Family History Family History  Problem Relation Age of Onset  . Hypertension Mother   . Osteoarthritis Mother   . Heart attack Father   . Stroke Father   . Alzheimer's disease Father   . Hypertension Father   . Heart attack Brother     Social History Social History   Tobacco Use  . Smoking status: Never Smoker  . Smokeless tobacco: Never Used    Vaping Use  . Vaping Use: Never used  Substance Use Topics  . Alcohol use: No  . Drug use: No     Allergies   Latex   Review of Systems Review of Systems  Constitutional: Negative for activity change, appetite change, chills, diaphoresis, fatigue and fever.  HENT: Negative.   Eyes: Negative.   Respiratory: Negative for cough, chest tightness, shortness of breath and wheezing.   Cardiovascular: Negative.   Gastrointestinal: Negative for abdominal distention, abdominal pain, blood in stool, diarrhea, nausea and vomiting.  Genitourinary: Positive for flank pain. Negative for dysuria, frequency, hematuria, pelvic pain and urgency.  Musculoskeletal: Positive for back pain.  Skin: Negative for rash.  Neurological: Negative.   Hematological: Negative.      Physical Exam Triage Vital Signs ED Triage Vitals  Enc Vitals Group     BP 04/02/20 1458 (!) 146/83     Pulse Rate 04/02/20 1458 90     Resp 04/02/20 1458 15     Temp 04/02/20 1458 98.9 F (37.2 C)     Temp Source 04/02/20 1458 Oral     SpO2 04/02/20 1458 99 %     Weight --      Height --      Head Circumference --      Peak Flow --      Pain Score 04/02/20 1456 8     Pain Loc --      Pain Edu? --      Excl. in [place]? --    No data found.  Updated Vital Signs BP (!) 146/83 (BP Location: Right Arm)   Pulse 90   Temp 98.9 F (37.2 C) (Oral)   Resp 15   SpO2 99%   Visual Acuity Right Eye Distance:   Left Eye Distance:   Bilateral Distance:    Right Eye Near:   Left Eye Near:    Bilateral Near:     Physical Exam Vitals and nursing note reviewed.  Constitutional:      General: She is not in acute distress.    Appearance: She is not ill-appearing.  HENT:     Head: Normocephalic.     Right Ear: External ear normal.     Left Ear: External ear normal.     Nose: Nose normal.     Mouth/Throat:  Mouth: Mucous membranes are moist.     Pharynx: Oropharynx is clear.  Eyes:     Extraocular Movements:  Extraocular movements intact.     Conjunctiva/sclera: Conjunctivae normal.     Pupils: Pupils are equal, round, and reactive to light.  Cardiovascular:     Rate and Rhythm: Normal rate and regular rhythm.     Heart sounds: Normal heart sounds.  Pulmonary:     Breath sounds: Normal breath sounds.       Comments: Left posterior/inferior rib tenderness extend to left lumbar area. Abdominal:     General: Abdomen is flat. There is no distension.     Palpations: Abdomen is soft.     Tenderness: There is no abdominal tenderness. There is left CVA tenderness. There is no right CVA tenderness.  Musculoskeletal:     Cervical back: Neck supple.       Back:     Right lower leg: No edema.     Left lower leg: No edema.     Comments: Tenderness left LS area  Lymphadenopathy:     Cervical: No cervical adenopathy.  Skin:    General: Skin is warm and dry.     Findings: No rash.  Neurological:     Mental Status: She is alert and oriented to person, place, and time.      UC Treatments / Results  Labs (all labs ordered are listed, but only abnormal results are displayed) Labs Reviewed  POCT URINALYSIS DIP (MANUAL ENTRY) - Abnormal; Notable for the following components:      Result Value   Ketones, POC UA trace (5) (*)    Spec Grav, UA >=1.030 (*)    Protein Ur, POC trace (*)    All other components within normal limits    EKG   Radiology DG Ribs Unilateral W/Chest Left  Result Date: 04/02/2020 CLINICAL DATA:  59 year old female with left chest wall pain. EXAM: LEFT RIBS AND CHEST - 3+ VIEW COMPARISON:  None FINDINGS: No focal consolidation, pleural effusion, or pneumothorax. The cardiac silhouette is within limits. No acute osseous pathology. No displaced rib fractures. Multiple gallstones noted in the right upper quadrant. IMPRESSION: 1. No acute cardiopulmonary process. No displaced rib fractures. No pneumothorax. 2. Cholelithiasis. Electronically Signed   By: Anner Crete M.D.    On: 04/02/2020 16:06   DG Lumbar Spine Complete  Result Date: 04/02/2020 CLINICAL DATA:  Four day history of left lower rib and lumbar area pain. History of partial left nephrectomy for kidney cancer. EXAM: LUMBAR SPINE - COMPLETE 4+ VIEW COMPARISON:  Abdominal CT 05/13/2017. Rib radiographs are performed concurrently. FINDINGS: There is hemi transitional lumbosacral anatomy with enlarged left transverse process. Concurrent rib radiograph demonstrates 11 pairs of ribs. The lower most lumbar vertebra will be labeled L5. Slight dextroscoliotic curvature of the upper lumbar spine. No listhesis. There is disc space narrowing and endplate spurring at C1-Y6 and to a lesser extent L5-S1 and L1-L2. Mild facet hypertrophy at L4-L5 and L5-S1. Vertebral body heights are preserved. No fracture. No radiographic evidence of focal lesion. Calcified gallstones in the right upper quadrant. IMPRESSION: 1. Hemi transitional lumbosacral anatomy with enlarged left transverse process of L5. 2. Degenerative disc disease at L4-L5 and to a lesser extent L5-S1 and L1-L2. Mild facet hypertrophy at L4-L5 and L5-S1. Electronically Signed   By: Keith Rake M.D.   On: 04/02/2020 16:07   CT Renal Stone Study  Result Date: 04/02/2020 CLINICAL DATA:  Left lower quadrant pain history of  partial left nephrectomy EXAM: CT ABDOMEN AND PELVIS WITHOUT CONTRAST TECHNIQUE: Multidetector CT imaging of the abdomen and pelvis was performed following the standard protocol without IV contrast. COMPARISON:  CT 05/13/2017 FINDINGS: Lower chest: No acute abnormality. Hepatobiliary: No focal liver abnormality is seen. Large calcified gallstones. No biliary dilatation. Pancreas: Unremarkable. No pancreatic ductal dilatation or surrounding inflammatory changes. Spleen: Normal in size without focal abnormality. Adrenals/Urinary Tract: Adrenal glands are normal. No hydronephrosis. Status post partial nephrectomy on the left with removal of previously noted  cyst and solid mass from the lower pole. Mild soft tissue density with punctate calcification probably represents scarring. The bladder is unremarkable Stomach/Bowel: Stomach is within normal limits. Status post appendectomy. No evidence of bowel wall thickening, distention, or inflammatory changes. Vascular/Lymphatic: Moderate aortic atherosclerosis. No aneurysm. No suspicious nodes. Reproductive: Bilateral ligation clips. No adnexal mass. Uterus unremarkable Other: No abdominal wall hernia or abnormality. No abdominopelvic ascites. Musculoskeletal: No acute or significant osseous findings. IMPRESSION: 1. No CT evidence for acute intra-abdominal or pelvic abnormality. 2. Status post partial nephrectomy on the left with removal of previously noted cyst and solid mass from the lower pole. Mild soft tissue density with punctate calcification at the lower pole probably represents scarring. 3. Gallstones. Aortic Atherosclerosis (ICD10-I70.0). Electronically Signed   By: Donavan Foil M.D.   On: 04/02/2020 16:56    Procedures Procedures (including critical care time)  Medications Ordered in UC Medications  ketorolac (TORADOL) injection 60 mg (60 mg Intramuscular Given 04/02/20 1603)    Initial Impression / Assessment and Plan / UC Course  I have reviewed the triage vital signs and the nursing notes.  Pertinent labs & imaging results that were available during my care of the patient were reviewed by me and considered in my medical decision making (see chart for details).    Administered Toradol 60mg  IM  Urinalysis unremarkable.  CT renal shows no evidence left kidney stones or hydroureter. Left ribs unilateral x-rays show no evidence rib fracture or cardiopulmonary disease. Lumbar spine x-rays show degenerative disc disease and mild facet hypertrophy. ?radicular pain. ?early herpes zoster.  Begin prednisone burst/taper. Followup with Family Doctor if not improved in one week.    Final Clinical  Impressions(s) / UC Diagnoses   Final diagnoses:  Rib pain on left side  Acute left flank pain     Discharge Instructions     May take pain medication as prescribed. Call if rash develops.    ED Prescriptions    Medication Sig Dispense Auth. Provider   predniSONE (DELTASONE) 20 MG tablet Take one tab by mouth twice daily for 4 days, then one daily. Take with food. 12 tablet [name], MD        [name], MD 04/05/20 [phone]

## 2020-04-02 NOTE — Discharge Instructions (Addendum)
May take pain medication as prescribed. Call if rash develops.

## 2020-04-02 NOTE — ED Notes (Signed)
Assisted patient to car to wait for her ride home. Patient was able to ambulate with minimal assistance. Provided with imaging disc to give to her other physician.

## 2020-10-20 ENCOUNTER — Ambulatory Visit: Payer: Managed Care, Other (non HMO) | Admitting: Neurology

## 2020-11-27 ENCOUNTER — Encounter: Payer: Self-pay | Admitting: Obstetrics & Gynecology

## 2020-11-27 ENCOUNTER — Other Ambulatory Visit: Payer: Self-pay

## 2020-11-27 ENCOUNTER — Ambulatory Visit (INDEPENDENT_AMBULATORY_CARE_PROVIDER_SITE_OTHER): Payer: Managed Care, Other (non HMO) | Admitting: Obstetrics & Gynecology

## 2020-11-27 VITALS — BP 136/80 | Ht 61.5 in | Wt 186.0 lb

## 2020-11-27 DIAGNOSIS — Z780 Asymptomatic menopausal state: Secondary | ICD-10-CM | POA: Diagnosis not present

## 2020-11-27 DIAGNOSIS — M8589 Other specified disorders of bone density and structure, multiple sites: Secondary | ICD-10-CM

## 2020-11-27 DIAGNOSIS — E6609 Other obesity due to excess calories: Secondary | ICD-10-CM | POA: Diagnosis not present

## 2020-11-27 DIAGNOSIS — Z01419 Encounter for gynecological examination (general) (routine) without abnormal findings: Secondary | ICD-10-CM | POA: Diagnosis not present

## 2020-11-27 DIAGNOSIS — Z6834 Body mass index (BMI) 34.0-34.9, adult: Secondary | ICD-10-CM

## 2020-11-27 NOTE — Progress Notes (Signed)
Amanda Brewer 15-Dec-1960 [id number]   History:    60 y.o. G2P1A1L1  [id number] presenting for annual gyn exam   IOX:BDZH menopause well on no HRT x 02/2020.  No PMB. S/P HSC/Myosure excision/D+C on 02/15/2020, benign polyps (endocervical/endometrial).  No pelvic pain. Sexually active, difficulty reaching an orgasm.  Major Depression with no suicidal ideations, stable now on Zoloft 150 mg daily. BMI 34.58. Urine/BMs normal.  Breasts normal.  Health labs with Fam MD. Patient is on atorvastatin and Plaquenil.  Past medical history,surgical history, family history and social history were all reviewed and documented in the EPIC chart.  Gynecologic History No LMP recorded. Patient is postmenopausal.  Obstetric History OB History  Gravida Para Term Preterm AB Living  2 1     1 1   SAB IAB Ectopic Multiple Live Births  1            # Outcome Date GA Lbr Len/2nd Weight Sex Delivery Anes PTL Lv  2 SAB           1 Para              ROS: A ROS was performed and pertinent positives and negatives are included in the history.  GENERAL: No fevers or chills. HEENT: No change in vision, no earache, sore throat or sinus congestion. NECK: No pain or stiffness. CARDIOVASCULAR: No chest pain or pressure. No palpitations. PULMONARY: No shortness of breath, cough or wheeze. GASTROINTESTINAL: No abdominal pain, nausea, vomiting or diarrhea, melena or bright red blood per rectum. GENITOURINARY: No urinary frequency, urgency, hesitancy or dysuria. MUSCULOSKELETAL: No joint or muscle pain, no back pain, no recent trauma. DERMATOLOGIC: No rash, no itching, no lesions. ENDOCRINE: No polyuria, polydipsia, no heat or cold intolerance. No recent change in weight. HEMATOLOGICAL: No anemia or easy bruising or bleeding. NEUROLOGIC: No headache, seizures, numbness, tingling or weakness. PSYCHIATRIC: No depression, no loss of interest in normal activity or change in sleep pattern.     Exam:   BP 136/80    Ht 5' 1.5" (1.562 m)   Wt 186 lb (84.4 kg)   BMI 34.58 kg/m   Body mass index is 34.58 kg/m.  General appearance : Well developed well nourished female. No acute distress HEENT: Eyes: no retinal hemorrhage or exudates,  Neck supple, trachea midline, no carotid bruits, no thyroidmegaly Lungs: Clear to auscultation, no rhonchi or wheezes, or rib retractions  Heart: Regular rate and rhythm, no murmurs or gallops Breast:Examined in sitting and supine position were symmetrical in appearance, no palpable masses or tenderness,  no skin retraction, no nipple inversion, no nipple discharge, no skin discoloration, no axillary or supraclavicular lymphadenopathy Abdomen: no palpable masses or tenderness, no rebound or guarding Extremities: no edema or skin discoloration or tenderness  Pelvic: Vulva: Normal             Vagina: No gross lesions or discharge  Cervix: No gross lesions or discharge  Uterus  AV, normal size, shape and consistency, non-tender and mobile  Adnexa  Without masses or tenderness  Anus: Normal   Assessment/Plan:  60 y.o. female for annual exam   1. Well female exam with routine gynecological exam Normal gynecologic exam in menopause.  Pap test March 2021 was negative, no indication to repeat this year.  Breast exam normal.  Per patient screening mammogram was - October 2021, will obtain report.  Colonoscopy 2018.  Health labs with family physician.  2. Postmenopause Well on no hormone replacement therapy since June 2021,  except for difficulty reaching orgasm.  Counseling done on the effective the low hormone status on the clitoris.  Restarting hormone replacement therapy and or adding testosterone cream discussed with patient.  Risks and benefits reviewed.  Patient prefers observation at this time.  Recommend self stimulation and lubricant to stimulate the area of the clitoris and increase vascularity.  3. Osteopenia of multiple sites Bone density showing osteopenia  September 2020.  We will repeat through her family physician.  Vitamin D supplements, calcium intake of 1.5 g total per day and regular weightbearing physical activity is recommended.  4. Class 1 obesity due to excess calories with serious comorbidity and body mass index (BMI) of 34.0 to 34.9 in adult Recommend a lower calorie/carb diet.  Aerobic activities 5 times a week and light weightlifting every 2 days.  [name] MD, 8:20 AM 11/27/2020

## 2021-01-06 ENCOUNTER — Telehealth: Payer: Self-pay | Admitting: *Deleted

## 2021-01-06 NOTE — Telephone Encounter (Signed)
Left message for patient to call with information for the below imaging center.

## 2021-01-06 NOTE — Telephone Encounter (Signed)
-----   Message from Leamon Arnt, [place] sent at 01/05/2021  3:14 PM EDT ----- Patient is needing a DEXA order sent to [facility] in [place].

## 2021-01-13 ENCOUNTER — Other Ambulatory Visit: Payer: Self-pay

## 2021-01-13 ENCOUNTER — Ambulatory Visit: Payer: Managed Care, Other (non HMO) | Admitting: Physician Assistant

## 2021-01-13 ENCOUNTER — Encounter: Payer: Self-pay | Admitting: Physician Assistant

## 2021-01-13 VITALS — BP 120/74 | HR 86 | Ht 62.0 in | Wt 193.0 lb

## 2021-01-13 DIAGNOSIS — F809 Developmental disorder of speech and language, unspecified: Secondary | ICD-10-CM | POA: Diagnosis not present

## 2021-01-13 DIAGNOSIS — Z974 Presence of external hearing-aid: Secondary | ICD-10-CM | POA: Insufficient documentation

## 2021-01-13 NOTE — Progress Notes (Signed)
Assessment/Plan:   Communication Deficit   Ms. Rentz is a pleasant 60 year old right-handed woman who had progressive memory changes over the last 5 years.  She has undergone several radiological studies, as well as psychological evaluations, all negative for neurodegenerative conditions.  At this point, it appears that the patient could benefit from psychological and psychiatric evaluation, in order to further help her from the emotional standpoint and medications adjustments.  Recommendations are as follows   Referral to Psychiatry and Psychology-the office is to call her for scheduling . Discussed the importance of regular daily schedule with inclusion of crossword puzzles to maintain brain function.  . Recommend to stay active exercising  at least 30 minutes at least 3 times a week.  Mediterranean diet   Follow up as needed   Case discussed with Dr. Delice Lesch who agrees with the plan     Subjective:    Tynlee Bayle is a pleasant 60 year old right-handed woman with a history of hyperlipidemia, rheumatoid arthritis, depression, OSA on CPAP, initially presenting for evaluation of memory changes in 2021.  The patient had been prior ly seen and undergone several films, as well as psychological evaluation negative for a neurodegenerative condition.  Last SLUMS score was 23/30.  TSH and B12 were normal. Previous records as well as any outside records available were reviewed prior to todays visit.    Since the last visit, the patient underwent an repeat neurocognitive testing, which also was negative.  Rather, couples counseling was indicated to deal with communication issues, which may have affected her cognitive process.  Her husband and her parents have not started counseling together, and planning to start separate sessions soon.  She feels that her memory is worse, as she has to compensate by closing her eyes and focus on the issue.  She is comprehending some words, but no other, although  this has been ongoing.  She states that after 3 times of repeating the same words, she obtains retention.  At times she cannot remember the passcode her work extension, although this has been always the same number.  She has been written a list to not forget.  She reads and does puzzles and card games to help her.  She feels that stress has contributed to the symptoms.  At home, her mother-in-law has moved to and after the death of her father-in-law a few months ago.  She is also caring for her husband who has fibromyalgia, IBS and depression.  She denies hallucinations or paranoia.  Mood is well controlled with sertraline 100 mg daily.  Irritability did not "change much ".  She feels that at times she is depressed.  She sleeps 5 to 9 hours a day, no naps.  She denies any sleepwalking.  She dresses and basis independently.  Denies missing any medications.  Denies leaving objects in unusual places.  She pays all the bills, she is an Optometrist.  She denies missing any payments or over paying them.  Her appetite is good, she has chronic constipation, and is working with digestion specialist since February.  She denies any diarrhea.  She denies any trouble swallowing.  She cooks and denies leaving the stove on or the faucet on.  She ambulates independently, without the use of a cane or walker.  She is not very active outside of the house.  Her last fall was about 1 month ago, at the parking lot during the rainy day, but did not seek medical attention despite hitting her head.  She  denies any symptoms from this such as dizziness, double vision, unilateral weakness, numbness or tingling.  Denies urinary retention or incontinence. She also has a hard time sharing feelings, which "borrow up and make the stress worse ".  She wonders if she can use "lunaTC, or any ADHD medication "        "INITIAL HISTORY OF PRESENT ILLNESS: This is a 60 year old right-handed woman with a history of hyperlipidemia, rheumatoid  arthritis, depression, OSA on CPAP, presenting for evaluation of memory changes. Her husband is present to provide additional information. Her husband reports that memory changes started around 2014, progressively worsening in the past year with short-term memory loss and communication issues. She feels her memory is getting worse, it takes so much concentration to carry on a conversation. She cannot get distracted and has to close her eyes to think. 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 She has a list of things to do. 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 She has a 40-minute commute and denies getting lost driving. She manages bills, a lot are on direct pay. She may forget medications if out of routine, she uses alarms. She misplaces things, catching herself in the middle of putting things out of place, especially when distracted. She wrote down her concerns, saying she has always been "quiet" but did not have trouble talking socially, especially with friends. It has always been difficult for her to talk about feelings and communicate with close relationships. Working with numbers and processes make sense to her and come naturally. Now it takes much work to concentrate on listening, hearing, and responding, she shies away from communication, even at work. When she is on the spot, her mind goes blank, at work or at home, occurring more often now. If she does not write something down, she forgets. When she does write something, she can only remember the beginning or end of the sentence, her husband has to talk slow so she can get it all. She would then forget they had talked about it, even if she wrote it down. She wonders about selective hearing or memory loss, when she is busy she does not "see" a lot. When off work on weekends, she finally remembers things like meat needing to be put in  the freezer and things that need to be done. She recalls she needs to shop but not the items or where her list is. When asked general questions such as how her day was, she is hard pressed to remember. If asked specific questions, she can focus and answer. She has a hard time thinking of a word to describe how she is feeling when asked, but if given a choice, she can pick out one that would describe how she is feeling.  Her husband agrees with this, he reports her verbal communication is very labored. She struggles to find words, sometimes using the wrong words or the opposite of what she means. Her husband is a Social worker and is constantly working at what she really means to say. He would ask her repeatedly if she meant what she was saying, her favorite phrases are "I don't remember, I don't know, I forgot." He reports it is hard to function together in a household because of this, he has to think for both of them, and it is impossible to trust what she  says, guessing what she means all the time. He reports a lot of responsibility for things at home are on him and he feels very alone.   She has occasional dizziness when standing. She has occasional swallowing difficulties and attributes this to a thyroid nodule, she has to lay down a certain way. She has some neck achiness, numbness and tingling in her hands. She denies any headaches, diplopia, dysarthria, back pain, bowel/bladder dysfunction, anosmia, or tremors. She has a CPAP but still feels tired. She has been fatigued and sleeping a lot lately. She got very depressed 3-4 months ago and was sleeping more, going to bed to cry/sleep. She had vaginal bleeding with plans for cervical polyp surgery and was taken off her hormone patch. This has all helped with depression/mood swings but she is still sleeping more than normal. Her husband reports the sleep issue is really notable. She has an upcoming appointment for CPAP adjustment.  She is taking Zoloft and  hormone supplements and feels her mood is better than it was. She endorses with everything going on, there is more stress, she sees a therapist. They took a week of vacation a few months ago and she was a lot better with communicating, still had problems but there was a noticeable difference without work stress. Her husband denies any paranoia or hallucinations. She feels wobbly when walking, she fell a year ago and broke her teeth, saying she tripped on a ramp. Her husband reports that this is the first time he has heard her say this as a reason, she has always said "I don't know" when asked what happened with the fall. She reports feeling wobbly 3 days ago, lasting for a whole day. Her father and paternal uncle had dementia. Her paternal grandmother had a mental illness that was fairly severe with no clear diagnosis. No history of significant head injuries, she does not drink alcohol.   She has seen neurologists in the past for the same concerns. Records were reviewed. She was evaluated by Dr. Berdine Addison in 2016 and had extensive normal bloodwork. 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  She had a normal EEG. Neuropsychological evaluation by Dr. Rica Mote in 2016 was normal, there was no compelling evidence of cognitive or emotional problems. It was noted that it may be her difficulty communicating with her husband/relationship issues than any medical problem. She was evaluated by neurologist Dr. Blenda Nicely for a second opinion in 2017 and had repeat Neuropsychological testing in 2018, and it was noted that comparison with prior testing was difficult due to different measures being used, however the current scores did not indicate a clinically significant decline. "    PREVIOUS MEDICATIONS: None  CURRENT MEDICATIONS:  Outpatient Encounter Medications as of 01/13/2021  Medication  Sig  . aspirin 81 MG chewable tablet Chew by mouth daily.  Marland Kitchen atorvastatin (LIPITOR) 10 MG tablet Take 10 mg by mouth daily.  . Calcium Carb-Cholecalciferol 600-200 MG-UNIT TABS Take by mouth 2 (two) times daily.   Marland Kitchen etanercept (ENBREL) 50 MG/ML injection Inject 50 mg into the skin once a week.  . hydroxychloroquine (PLAQUENIL) 200 MG tablet Take 200 mg by mouth 2 (two) times daily.   . montelukast (SINGULAIR) 10 MG tablet Take 10 mg by mouth daily. At supper  . sertraline (ZOLOFT) 100 MG tablet Take 150 mg by mouth every morning.   . [DISCONTINUED] predniSONE (DELTASONE) 20 MG  tablet Take one tab by mouth twice daily for 4 days, then one daily. Take with food.   No facility-administered encounter medications on file as of 01/13/2021.     Objective:     PHYSICAL EXAMINATION:    VITALS:   Vitals:   01/13/21 1301  BP: 120/74  Pulse: 86  SpO2: 97%  Weight: 193 lb (87.5 kg)  Height: 5\' 2"  (1.575 m)    GEN:  The patient appears stated age and is in NAD. HEENT:  Normocephalic, atraumatic.   Neurological examination:  Orientation: The patient is alert. Oriented to person, place and date  Cranial nerves: There is good facial symmetry.The speech is fluent and clear . No aphasia or dysarthria. Fund of knowledge is appropriate. Recent and remote memory are intact. Attention and concentration are normal. Able to name objects and repeat phrases.  Hearing is intact to conversational tone.    Sensation: Sensation is intact to light touch throughout Motor: Strength is at least antigravity x4.  Montreal Cognitive Assessment  02/21/2020  Visuospatial/ Executive (0/5) 5  Naming (0/3) 3  Attention: Read list of digits (0/2) 1  Attention: Read list of letters (0/1) 1  Attention: Serial 7 subtraction starting at 100 (0/3) 3  Language: Repeat phrase (0/2) 1  Language : Fluency (0/1) 1  Abstraction (0/2) 1  Delayed Recall (0/5) 5  Orientation (0/6) 6  Total 27  Adjusted Score (based on  education) Hawthorne Exam 02/01/2020  Weekday Correct 1  Current year 1  What state are we in? 1  Amount spent 1  Amount left 2  # of Animals 2  5 objects recall 3  Number series 0  Hour markers 2  Time correct 0  Placed X in triangle correctly 1  Largest Figure 1  Name of female 2  Date back to work 2  Type of work [place] she lived in 2  Total score 23      Movement examination: Tone: There is normal tone in the UE/LE Abnormal movements:  no tremor.  No myoclonus.  No asterixis.   Coordination:  There is no decremation with RAM's. .  Gait and Station: The patient has no difficulty arising out of a deep-seated chair without the use of the hands. The patient's stride length is good.  Gait is cautious and narrow.      Total time spent on today's visit was 55 minutes, including both face-to-face time and nonface-to-face time.  Time included that spent on review of records (prior notes available to me/labs/imaging if pertinent), discussing treatment and goals, answering patient's questions and coordinating care.  Cc:  [place], MD Sharene Butters, PA-C

## 2021-01-13 NOTE — Patient Instructions (Addendum)
It was a pleasure to see you today at our office.   Recommendations:  Return as needed Referal to Psychiatry and Psychotherapy are being ordered, the offices will be calling you     RECOMMENDATIONS FOR ALL PATIENTS WITH MEMORY PROBLEMS: 1. Continue to exercise (Recommend 30 minutes of walking everyday, or 3 hours every week) 2. Increase social interactions - continue going to [place] and enjoy social gatherings with friends and family 3. Eat healthy, avoid fried foods and eat more fruits and vegetables 4. Maintain adequate blood pressure, blood sugar, and blood cholesterol level. Reducing the risk of stroke and cardiovascular disease also helps promoting better memory. 5. Avoid stressful situations. Live a simple life and avoid aggravations. Organize your time and prepare for the next day in anticipation. 6. Sleep well, avoid any interruptions of sleep and avoid any distractions in the bedroom that may interfere with adequate sleep quality 7. Avoid sugar, avoid sweets as there is a strong link between excessive sugar intake, diabetes, and cognitive impairment We discussed the Mediterranean diet, which has been shown to help patients reduce the risk of progressive memory disorders and reduces cardiovascular risk. This includes eating fish, eat fruits and green leafy vegetables, nuts like almonds and hazelnuts, walnuts, and also use olive oil. Avoid fast foods and fried foods as much as possible. Avoid sweets and sugar as sugar use has been linked to worsening of memory function.  There is always a concern of gradual progression of memory problems. If this is the case, then we may need to adjust level of care according to patient needs. Support, both to the patient and caregiver, should then be put into place.     Mediterranean Diet A Mediterranean diet refers to food and lifestyle choices that are based on the traditions of countries located on the [employer]. This way of eating has been  shown to help prevent certain conditions and improve outcomes for people who have chronic diseases, like kidney disease and heart disease. What are tips for following this plan? Lifestyle   Cook and eat meals together with your family, when possible.  Drink enough fluid to keep your urine clear or pale yellow.  Be physically active every day. This includes:  Aerobic exercise like running or swimming.  Leisure activities like gardening, walking, or housework.  Get 7-8 hours of sleep each night.  If recommended by your health care provider, drink red wine in moderation. This means 1 glass a day for nonpregnant women and 2 glasses a day for men. A glass of wine equals 5 oz (150 mL). Reading food labels   Check the serving size of packaged foods. For foods such as rice and pasta, the serving size refers to the amount of cooked product, not dry.  Check the total fat in packaged foods. Avoid foods that have saturated fat or trans fats.  Check the ingredients list for added sugars, such as corn syrup. Shopping   At the grocery store, buy most of your food from the areas near the walls of the store. This includes:  Fresh fruits and vegetables (produce).  Grains, beans, nuts, and seeds. Some of these may be available in unpackaged forms or large amounts (in bulk).  Fresh seafood.  Poultry and eggs.  Low-fat dairy products.  Buy whole ingredients instead of prepackaged foods.  Buy fresh fruits and vegetables in-season from local farmers markets.  Buy frozen fruits and vegetables in resealable bags.  If you do not have access to quality  fresh seafood, buy precooked frozen shrimp or canned fish, such as tuna, salmon, or sardines.  Buy small amounts of raw or cooked vegetables, salads, or olives from the deli or salad bar at your store.  Stock your pantry so you always have certain foods on hand, such as olive oil, canned tuna, canned tomatoes, rice, pasta, and beans. Cooking    Cook foods with extra-virgin olive oil instead of using butter or other vegetable oils.  Have meat as a side dish, and have vegetables or grains as your main dish. This means having meat in small portions or adding small amounts of meat to foods like pasta or stew.  Use beans or vegetables instead of meat in common dishes like chili or lasagna.  Experiment with different cooking methods. Try roasting or broiling vegetables instead of steaming or sauteing them.  Add frozen vegetables to soups, stews, pasta, or rice.  Add nuts or seeds for added healthy fat at each meal. You can add these to yogurt, salads, or vegetable dishes.  Marinate fish or vegetables using olive oil, lemon juice, garlic, and fresh herbs. Meal planning   Plan to eat 1 vegetarian meal one day each week. Try to work up to 2 vegetarian meals, if possible.  Eat seafood 2 or more times a week.  Have healthy snacks readily available, such as:  Vegetable sticks with hummus.  Greek yogurt.  Fruit and nut trail mix.  Eat balanced meals throughout the week. This includes:  Fruit: 2-3 servings a day  Vegetables: 4-5 servings a day  Low-fat dairy: 2 servings a day  Fish, poultry, or lean meat: 1 serving a day  Beans and legumes: 2 or more servings a week  Nuts and seeds: 1-2 servings a day  Whole grains: 6-8 servings a day  Extra-virgin olive oil: 3-4 servings a day  Limit red meat and sweets to only a few servings a month What are my food choices?  Mediterranean diet  Recommended  Grains: Whole-grain pasta. Brown rice. Bulgar wheat. Polenta. Couscous. Whole-wheat bread. [place].  Vegetables: Artichokes. Beets. Broccoli. Cabbage. Carrots. Eggplant. Green beans. Chard. Kale. Spinach. Onions. Leeks. Peas. Squash. Tomatoes. Peppers. Radishes.  Fruits: Apples. Apricots. Avocado. Berries. Bananas. Cherries. Dates. Figs. Grapes. Lemons. Melon. Oranges. Peaches. Plums. Pomegranate.  Meats and  other protein foods: Beans. Almonds. Sunflower seeds. Pine nuts. Peanuts. [place]. Salmon. Scallops. Shrimp. [place]. Tilapia. Clams. Oysters. Eggs.  Dairy: Low-fat milk. Cheese. Greek yogurt.  Beverages: Water. Red wine. Herbal tea.  Fats and oils: Extra virgin olive oil. Avocado oil. Grape seed oil.  Sweets and desserts: [place] yogurt with honey. Baked apples. Poached pears. Trail mix.  Seasoning and other foods: Basil. Cilantro. Coriander. Cumin. Mint. Parsley. Sage. Rosemary. Tarragon. Garlic. Oregano. Thyme. Pepper. Balsalmic vinegar. Tahini. Hummus. Tomato sauce. Olives. Mushrooms.  Limit these  Grains: Prepackaged pasta or rice dishes. Prepackaged cereal with added sugar.  Vegetables: Deep fried potatoes (french fries).  Fruits: Fruit canned in syrup.  Meats and other protein foods: Beef. Pork. Lamb. Poultry with skin. Hot dogs. [place] Salines.  Dairy: Ice cream. Sour cream. Whole milk.  Beverages: Juice. Sugar-sweetened soft drinks. Beer. Liquor and spirits.  Fats and oils: Butter. Canola oil. Vegetable oil. Beef fat (tallow). Lard.  Sweets and desserts: Cookies. Cakes. Pies. Candy.  Seasoning and other foods: Mayonnaise. Premade sauces and marinades.  The items listed may not be a complete list. Talk with your dietitian about what dietary choices are right for you. Summary  The Mediterranean diet  includes both food and lifestyle choices.  Eat a variety of fresh fruits and vegetables, beans, nuts, seeds, and whole grains.  Limit the amount of red meat and sweets that you eat.  Talk with your health care provider about whether it is safe for you to drink red wine in moderation. This means 1 glass a day for nonpregnant women and 2 glasses a day for men. A glass of wine equals 5 oz (150 mL). This information is not intended to replace advice given to you by your health care provider. Make sure you discuss any questions you have with your health care provider. Document Released:  04/15/2016 Document Revised: 05/18/2016 Document Reviewed: 04/15/2016 Elsevier Interactive Patient Education  2017 [employer].

## 2021-02-06 NOTE — Telephone Encounter (Signed)
Patient never called back encounter closed.

## 2021-08-12 ENCOUNTER — Encounter: Payer: Self-pay | Admitting: Obstetrics & Gynecology

## 2021-10-05 ENCOUNTER — Emergency Department
Admission: EM | Admit: 2021-10-05 | Discharge: 2021-10-05 | Disposition: A | Discharge status: Home / Self Care | Payer: Managed Care, Other (non HMO) | Source: Home / Self Care | Attending: Family Medicine | Admitting: Family Medicine

## 2021-10-05 ENCOUNTER — Other Ambulatory Visit: Payer: Self-pay

## 2021-10-05 DIAGNOSIS — M545 Low back pain, unspecified: Secondary | ICD-10-CM | POA: Diagnosis not present

## 2021-10-05 DIAGNOSIS — M069 Rheumatoid arthritis, unspecified: Secondary | ICD-10-CM

## 2021-10-05 DIAGNOSIS — M47816 Spondylosis without myelopathy or radiculopathy, lumbar region: Secondary | ICD-10-CM

## 2021-10-05 MED ORDER — HYDROCODONE-ACETAMINOPHEN 5-325 MG PO TABS: 1.0000 | ORAL_TABLET | Freq: Four times a day (QID) | ORAL | 0 refills | Status: AC | PRN

## 2021-10-05 NOTE — ED Triage Notes (Addendum)
Pt c/o Lt sided back pain x 3-4 days. Getting worse in last 24 hours. Seen here in 03/2020 for same problem, hx of DDD. Pain 5/10 Heat pack and tylenol prn. Last dose this am.

## 2021-10-05 NOTE — Discharge Instructions (Signed)
Use ice or heat to area Activity as tolerated Take pain medicine as needed Do not drive on the hydrocodone Follow up with your primary care

## 2021-10-06 NOTE — ED Provider Notes (Signed)
[facility]    CSN: [id number] Arrival date & time: 10/05/21  0909      History   Chief Complaint Chief Complaint  Patient presents with   Back Pain    HPI Amanda Brewer is a 61 y.o. female.   HPI  Patient has history of lumbar degenerative disc disease and periodic low back pain.  Her back currently has been bothering her for the last 3 to 4 days.  No accident or injury.  No overuse.  No change in activity.  She states that usually when she gets that she is better if she takes pain medication and rest for a day or 2.  She has used heat.  She has used Tylenol.  Past Medical History:  Diagnosis Date   Arthritis    Cancer of left kidney (HCC)    stage 1   Carpal tunnel syndrome of right wrist    Endometrial polyp    Gallstones    GERD (gastroesophageal reflux disease)    occ.    Hearing loss, right    uses hearing aide   History of iron deficiency anemia    with pregnancy   History of kidney stones    Hyperlipidemia    Hypertension    history of, no longer taking medication   Irregular heart beats    history of   Left renal mass    Multiple thyroid nodules    Obesity    OSA on CPAP    PMB (postmenopausal bleeding)    RA (rheumatoid arthritis) ([place])    Ruptured appendix    Seasonal allergies    Uses hearing aid    Wears glasses     Patient Active Problem List   Diagnosis Date Noted   Rheumatoid arthritis ([place]) 10/05/2021   DJD (degenerative joint disease), lumbar 10/05/2021   Uses hearing aid 01/13/2021   Ruptured appendicitis 05/13/2017   OSA on CPAP 09/07/2015    Past Surgical History:  Procedure Laterality Date   DILATATION & CURETTAGE/HYSTEROSCOPY WITH MYOSURE N/A 02/15/2020   Procedure: DILATATION & CURETTAGE/HYSTEROSCOPY WITH MYOSURE;  Surgeon: [name], MD;  Location: [facility];  Service: Gynecology;  Laterality: N/A;  request 8:30am OR time in iqueue held time for [facility] requests one hour OR time    DILATION AND CURETTAGE OF UTERUS     KIDNEY SURGERY Left 2018   LAPAROSCOPIC APPENDECTOMY N/A 05/13/2017   Procedure: APPENDECTOMY LAPAROSCOPIC;  Surgeon: Rolm Bookbinder, MD;  Location: [facility];  Service: General;  Laterality: N/A;   TUBAL LIGATION      OB History     Gravida  2   Para  1   Term      Preterm      AB  1   Living  1      SAB  1   IAB      Ectopic      Multiple      Live Births               Home Medications    Prior to Admission medications   Medication Sig Start Date End Date Taking? Authorizing Provider  aspirin 81 MG chewable tablet Chew by mouth daily.    [provider]  atorvastatin (LIPITOR) 10 MG tablet Take 10 mg by mouth daily.    [provider]  Calcium Carb-Cholecalciferol 600-200 MG-UNIT TABS Take by mouth 2 (two) times daily.     [provider]  etanercept (  ENBREL) 50 MG/ML injection Inject 50 mg into the skin once a week.    [provider]  HYDROcodone-acetaminophen (NORCO/VICODIN) 5-325 MG tablet Take 1-2 tablets by mouth every 6 (six) hours as needed. 10/05/21  Yes Raylene Everts, MD  hydroxychloroquine (PLAQUENIL) 200 MG tablet Take 200 mg by mouth 2 (two) times daily.     [provider]  montelukast (SINGULAIR) 10 MG tablet Take 10 mg by mouth daily. At supper    [provider]  sertraline (ZOLOFT) 100 MG tablet Take 150 mg by mouth every morning.     [provider]    Family History Family History  Problem Relation Age of Onset   Hypertension Mother    Osteoarthritis Mother    Heart attack Father    Stroke Father    Alzheimer's disease Father    Hypertension Father    Heart attack Brother     Social History Social History   Tobacco Use   Smoking status: Never   Smokeless tobacco: Never  Vaping Use   Vaping Use: Never used  Substance Use Topics   Alcohol use: No   Drug use: No     Allergies   Latex   Review of Systems Review of  Systems See HPI  Physical Exam Triage Vital Signs ED Triage Vitals  Enc Vitals Group     BP 10/05/21 0952 124/81     Pulse Rate 10/05/21 0952 81     Resp 10/05/21 0952 16     Temp 10/05/21 0952 98.6 F (37 C)     Temp Source 10/05/21 0952 Tympanic     SpO2 10/05/21 0952 99 %     Weight --      Height --      Head Circumference --      Peak Flow --      Pain Score 10/05/21 0954 5     Pain Loc --      Pain Edu? --      Excl. in [place]? --    No data found.  Updated Vital Signs BP 124/81 (BP Location: Right Arm)    Pulse 81    Temp 98.6 F (37 C) (Tympanic)    Resp 16    SpO2 99%       Physical Exam Constitutional:      General: She is not in acute distress.    Appearance: She is well-developed. She is not ill-appearing.  HENT:     Head: Normocephalic and atraumatic.  Eyes:     Conjunctiva/sclera: Conjunctivae normal.     Pupils: Pupils are equal, round, and reactive to light.  Cardiovascular:     Rate and Rhythm: Normal rate.  Pulmonary:     Effort: Pulmonary effort is normal. No respiratory distress.  Abdominal:     General: There is no distension.     Palpations: Abdomen is soft.  Musculoskeletal:        General: Normal range of motion.     Cervical back: Normal range of motion.     Comments: Mild tenderness in the left SI joint and left lumbar column of muscles.  Full range of motion.  Strength, sensation, range of motion, and reflexes are negative in both lower extremities with a negative straight leg raise bilaterally  Skin:    General: Skin is warm and dry.  Neurological:     General: No focal deficit present.     Mental Status: She is alert.  Sensory: No sensory deficit.     Motor: No weakness.     Coordination: Coordination normal.     Gait: Gait normal.     Deep Tendon Reflexes: Reflexes normal.  Psychiatric:        Mood and Affect: Mood normal.     UC Treatments / Results  Labs (all labs ordered are listed, but only abnormal results are  displayed) Labs Reviewed - No data to display  EKG   Radiology No results found.  Procedures Procedures (including critical care time)  Medications Ordered in UC Medications - No data to display  Initial Impression / Assessment and Plan / UC Course  I have reviewed the triage vital signs and the nursing notes.  Pertinent labs & imaging results that were available during my care of the patient were reviewed by me and considered in my medical decision making (see chart for details).     Patient can take Advil or Aleve over-the-counter.  I have prescribed a limited number of pain pills to take if she needs them for her pain.  Advised not to drive.  Return as needed Final Clinical Impressions(s) / UC Diagnoses   Final diagnoses:  Acute left-sided low back pain without sciatica     Discharge Instructions      Use ice or heat to area Activity as tolerated Take pain medicine as needed Do not drive on the hydrocodone Follow up with your primary care    ED Prescriptions     Medication Sig Dispense Auth. Provider   HYDROcodone-acetaminophen (NORCO/VICODIN) 5-325 MG tablet Take 1-2 tablets by mouth every 6 (six) hours as needed. 10 tablet Raylene Everts, MD      I have reviewed the PDMP during this encounter.   Raylene Everts, MD 10/06/21 1254

## 2021-12-06 IMAGING — CT CT RENAL STONE PROTOCOL
2 of 4 series · 16 of 46 positions shown, 18 images · non-contrast
Comparison: CT 05/13/2017

CLINICAL DATA: Left lower quadrant pain history of partial left
nephrectomy

EXAM:
CT ABDOMEN AND PELVIS WITHOUT CONTRAST
TECHNIQUE: Multidetector CT imaging of the abdomen and pelvis was performed
following the standard protocol without IV contrast.

[Series 2: axial st · axial · 0.73mm/px · z∈[+799,+1219]mm · 13 of 92 slices shown, 15 images]
[im 4/92  soft-tissue]
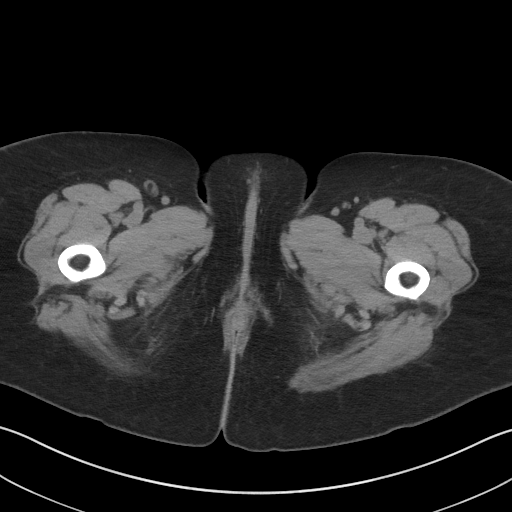
[im 4/92  bone]
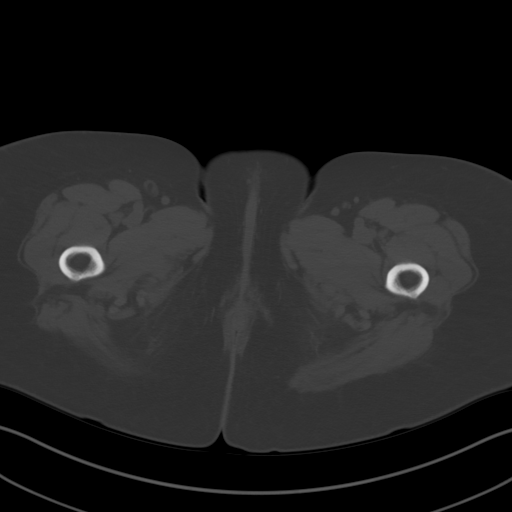
[im 11/92  soft-tissue]
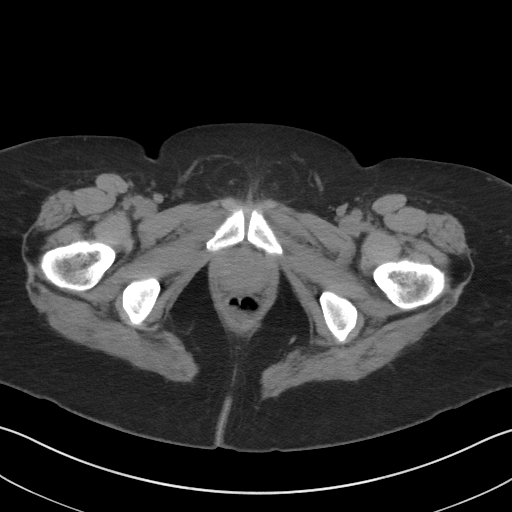
[im 18/92  soft-tissue]
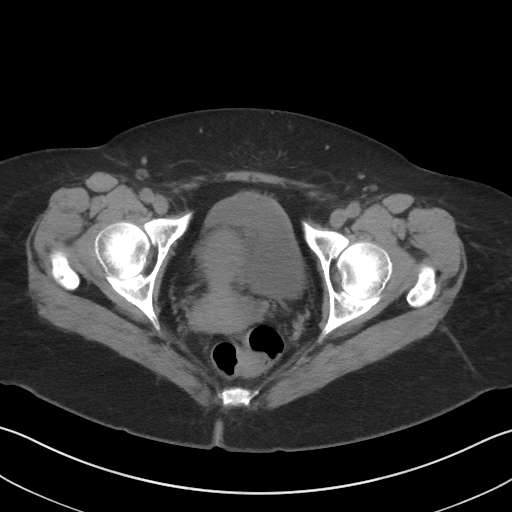
[im 25/92  soft-tissue]
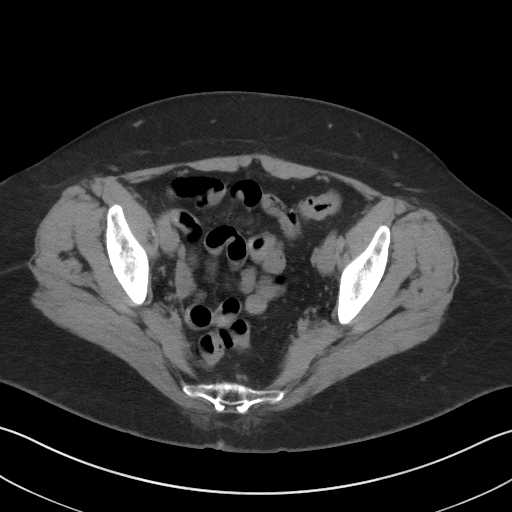
[im 32/92  soft-tissue]
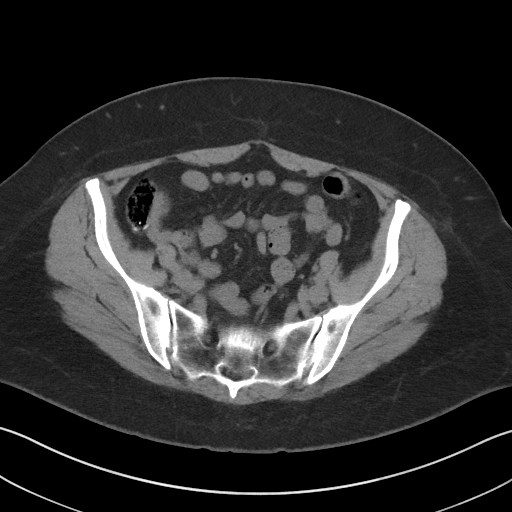
[im 39/92  soft-tissue]
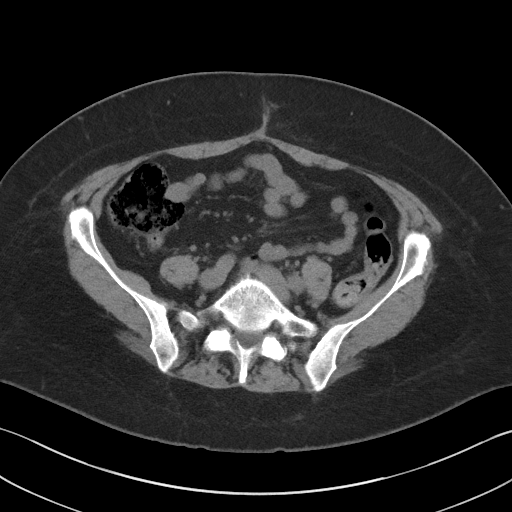
[im 46/92  soft-tissue]
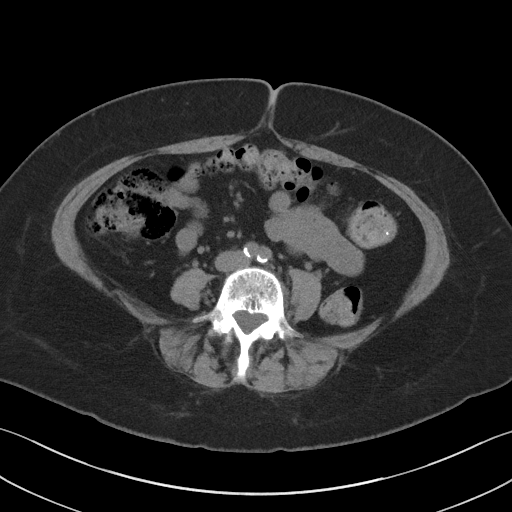
[im 53/92  soft-tissue]
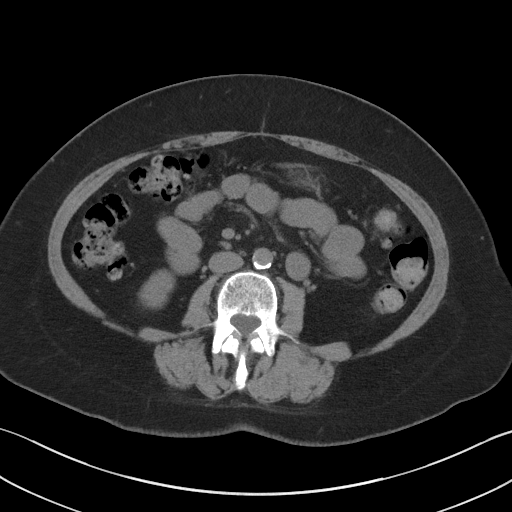
[im 60/92  soft-tissue]
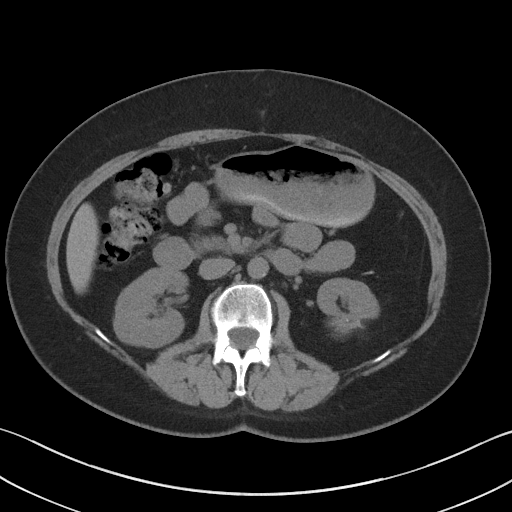
[im 60/92  bone]
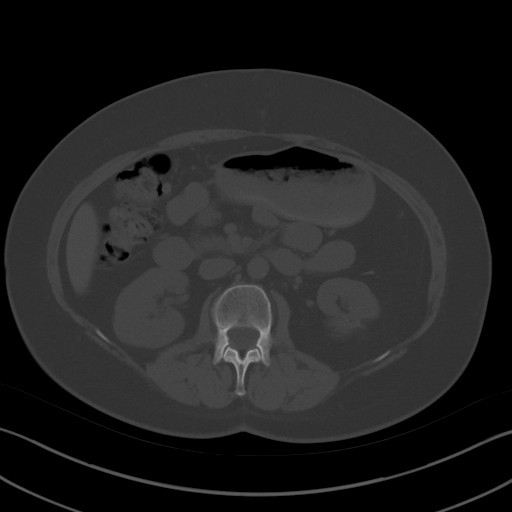
[im 67/92  soft-tissue]
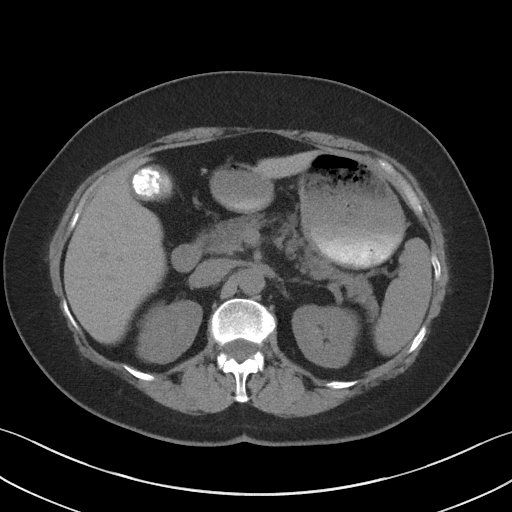
[im 74/92  soft-tissue]
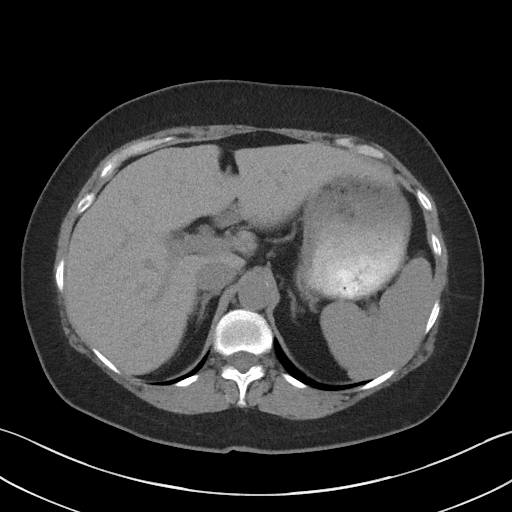
[im 81/92  soft-tissue]
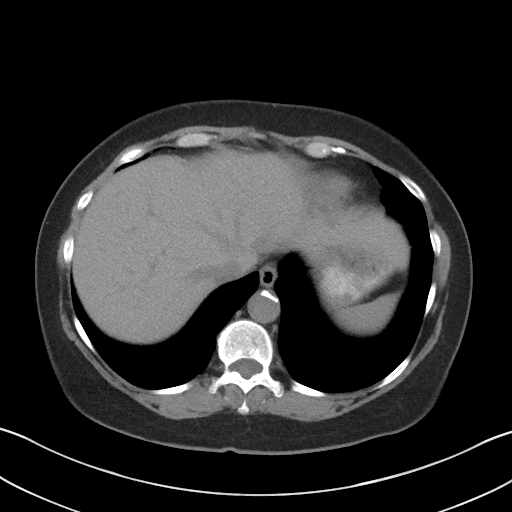
[im 88/92  soft-tissue]
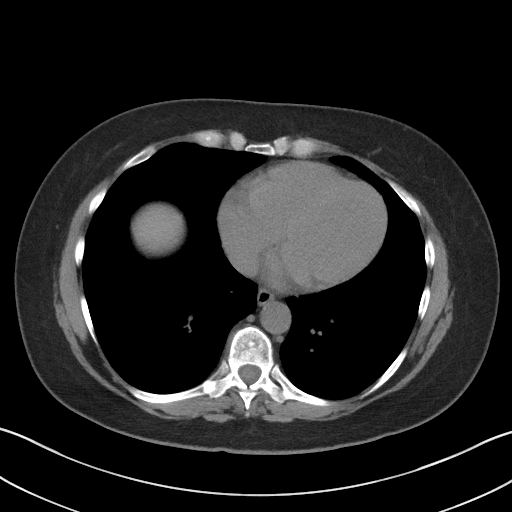

[Series 4: coronal st · coronal · 0.67mm/px · 3 of 87 slices shown]
[im 29/87  soft-tissue]
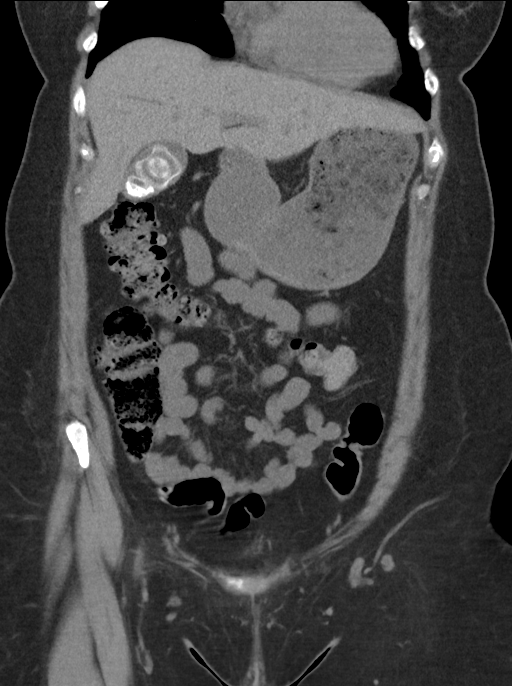
[im 39/87  soft-tissue]
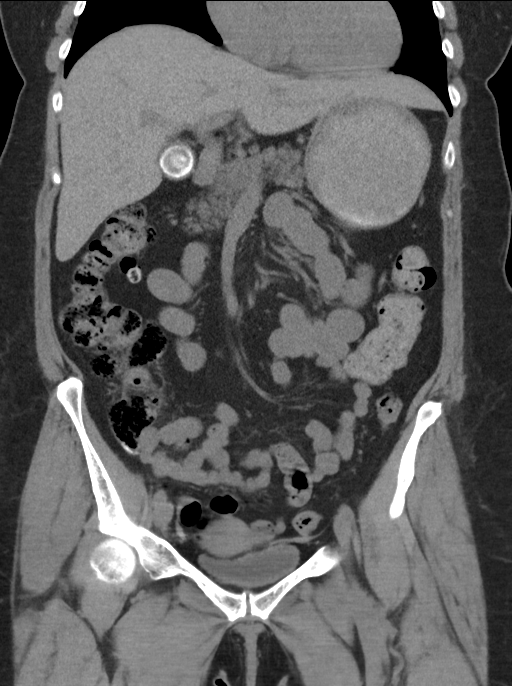
[im 48/87  soft-tissue]
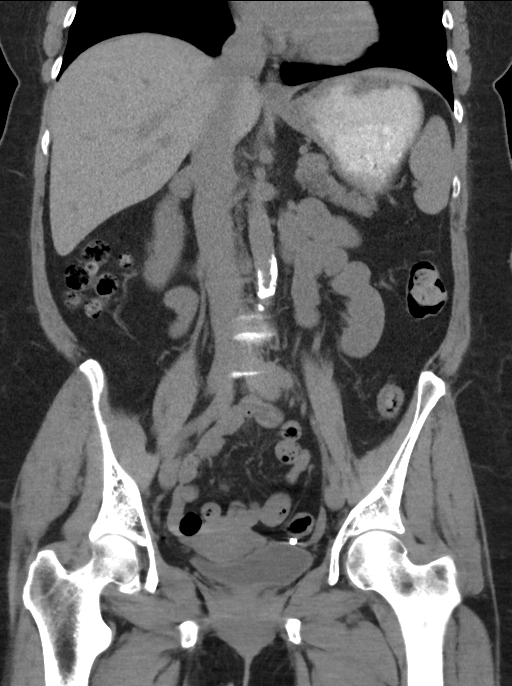

[16 of 46 positions shown; findings below may reference images not displayed]

FINDINGS: Lower chest: No acute abnormality.

Hepatobiliary: No focal liver abnormality is seen. Large calcified
gallstones. No biliary dilatation.

Pancreas: Unremarkable. No pancreatic ductal dilatation or
surrounding inflammatory changes.

Spleen: Normal in size without focal abnormality.

Adrenals/Urinary Tract: Adrenal glands are normal. No
hydronephrosis. Status post partial nephrectomy on the left with
removal of previously noted cyst and solid mass from the lower pole.
Mild soft tissue density with punctate calcification probably
represents scarring. The bladder is unremarkable

Stomach/Bowel: Stomach is within normal limits. Status post
appendectomy. No evidence of bowel wall thickening, distention, or
inflammatory changes.

Vascular/Lymphatic: Moderate aortic atherosclerosis. No aneurysm. No
suspicious nodes.

Reproductive: Bilateral ligation clips. No adnexal mass. Uterus
unremarkable

Other: No abdominal wall hernia or abnormality. No abdominopelvic
ascites.

Musculoskeletal: No acute or significant osseous findings.
IMPRESSION: 1. No CT evidence for acute intra-abdominal or pelvic abnormality.
2. Status post partial nephrectomy on the left with removal of
previously noted cyst and solid mass from the lower pole. Mild soft
tissue density with punctate calcification at the lower pole
probably represents scarring.
3. Gallstones.

Aortic Atherosclerosis ([id number]-L13.3).
